# Patient Record
Sex: Male | Born: 1960 | Race: White | Hispanic: No | Marital: Single | Smoking: Former smoker
Health system: Southern US, Community
[De-identification: ages and names within clinical notes are randomized; demographics above are authoritative.]

## PROBLEM LIST (undated history)

## (undated) DIAGNOSIS — R079 Chest pain, unspecified: Secondary | ICD-10-CM

## (undated) DIAGNOSIS — K219 Gastro-esophageal reflux disease without esophagitis: Secondary | ICD-10-CM

## (undated) DIAGNOSIS — B1920 Unspecified viral hepatitis C without hepatic coma: Secondary | ICD-10-CM

## (undated) DIAGNOSIS — I10 Essential (primary) hypertension: Secondary | ICD-10-CM

## (undated) DIAGNOSIS — I5021 Acute systolic (congestive) heart failure: Secondary | ICD-10-CM

## (undated) DIAGNOSIS — I428 Other cardiomyopathies: Secondary | ICD-10-CM

## (undated) DIAGNOSIS — R55 Syncope and collapse: Secondary | ICD-10-CM

## (undated) HISTORY — PX: HAND SURGERY: SHX662

## (undated) HISTORY — PX: TONSILLECTOMY: SUR1361

## (undated) HISTORY — PX: BASAL CELL CARCINOMA EXCISION: SHX1214

## (undated) HISTORY — PX: ORIF CONGENITAL HIP DISLOCATION: SHX2117

## (undated) HISTORY — PX: CARDIAC CATHETERIZATION: SHX172

---

## 1997-11-01 ENCOUNTER — Emergency Department (HOSPITAL_COMMUNITY): Admission: EM | Admit: 1997-11-01 | Discharge: 1997-11-01 | Payer: Self-pay | Admitting: Emergency Medicine

## 1997-11-01 ENCOUNTER — Encounter: Payer: Self-pay | Admitting: Emergency Medicine

## 2001-04-09 ENCOUNTER — Encounter: Payer: Self-pay | Admitting: Emergency Medicine

## 2001-04-09 ENCOUNTER — Emergency Department (HOSPITAL_COMMUNITY): Admission: EM | Admit: 2001-04-09 | Discharge: 2001-04-09 | Payer: Self-pay | Admitting: Emergency Medicine

## 2011-10-04 ENCOUNTER — Emergency Department (HOSPITAL_COMMUNITY): Payer: Self-pay

## 2011-10-04 ENCOUNTER — Inpatient Hospital Stay (HOSPITAL_COMMUNITY)
Admission: EM | Admit: 2011-10-04 | Discharge: 2011-10-09 | DRG: 287 | Disposition: A | Discharge status: Home / Self Care | Payer: 59 | Attending: Family Medicine | Admitting: Family Medicine

## 2011-10-04 ENCOUNTER — Encounter (HOSPITAL_COMMUNITY): Payer: Self-pay | Admitting: Emergency Medicine

## 2011-10-04 ENCOUNTER — Inpatient Hospital Stay (HOSPITAL_COMMUNITY): Payer: Self-pay

## 2011-10-04 DIAGNOSIS — F3289 Other specified depressive episodes: Secondary | ICD-10-CM | POA: Diagnosis present

## 2011-10-04 DIAGNOSIS — R079 Chest pain, unspecified: Secondary | ICD-10-CM | POA: Diagnosis present

## 2011-10-04 DIAGNOSIS — R0789 Other chest pain: Secondary | ICD-10-CM

## 2011-10-04 DIAGNOSIS — I5021 Acute systolic (congestive) heart failure: Principal | ICD-10-CM | POA: Diagnosis present

## 2011-10-04 DIAGNOSIS — B1920 Unspecified viral hepatitis C without hepatic coma: Secondary | ICD-10-CM | POA: Diagnosis present

## 2011-10-04 DIAGNOSIS — I428 Other cardiomyopathies: Secondary | ICD-10-CM | POA: Diagnosis present

## 2011-10-04 DIAGNOSIS — R51 Headache: Secondary | ICD-10-CM | POA: Diagnosis not present

## 2011-10-04 DIAGNOSIS — Z85828 Personal history of other malignant neoplasm of skin: Secondary | ICD-10-CM

## 2011-10-04 DIAGNOSIS — I498 Other specified cardiac arrhythmias: Secondary | ICD-10-CM | POA: Diagnosis present

## 2011-10-04 DIAGNOSIS — R209 Unspecified disturbances of skin sensation: Secondary | ICD-10-CM | POA: Diagnosis present

## 2011-10-04 DIAGNOSIS — F329 Major depressive disorder, single episode, unspecified: Secondary | ICD-10-CM | POA: Diagnosis present

## 2011-10-04 DIAGNOSIS — R42 Dizziness and giddiness: Secondary | ICD-10-CM | POA: Insufficient documentation

## 2011-10-04 DIAGNOSIS — R000 Tachycardia, unspecified: Secondary | ICD-10-CM

## 2011-10-04 DIAGNOSIS — R200 Anesthesia of skin: Secondary | ICD-10-CM | POA: Insufficient documentation

## 2011-10-04 DIAGNOSIS — R0602 Shortness of breath: Secondary | ICD-10-CM

## 2011-10-04 DIAGNOSIS — R6883 Chills (without fever): Secondary | ICD-10-CM | POA: Insufficient documentation

## 2011-10-04 DIAGNOSIS — I509 Heart failure, unspecified: Secondary | ICD-10-CM | POA: Diagnosis present

## 2011-10-04 DIAGNOSIS — Z8249 Family history of ischemic heart disease and other diseases of the circulatory system: Secondary | ICD-10-CM

## 2011-10-04 DIAGNOSIS — I10 Essential (primary) hypertension: Secondary | ICD-10-CM | POA: Diagnosis present

## 2011-10-04 DIAGNOSIS — I429 Cardiomyopathy, unspecified: Secondary | ICD-10-CM

## 2011-10-04 DIAGNOSIS — K219 Gastro-esophageal reflux disease without esophagitis: Secondary | ICD-10-CM | POA: Diagnosis present

## 2011-10-04 DIAGNOSIS — I252 Old myocardial infarction: Secondary | ICD-10-CM

## 2011-10-04 DIAGNOSIS — Z87891 Personal history of nicotine dependence: Secondary | ICD-10-CM

## 2011-10-04 DIAGNOSIS — R55 Syncope and collapse: Secondary | ICD-10-CM | POA: Diagnosis present

## 2011-10-04 DIAGNOSIS — R197 Diarrhea, unspecified: Secondary | ICD-10-CM | POA: Diagnosis present

## 2011-10-04 DIAGNOSIS — K739 Chronic hepatitis, unspecified: Secondary | ICD-10-CM

## 2011-10-04 DIAGNOSIS — F411 Generalized anxiety disorder: Secondary | ICD-10-CM | POA: Diagnosis present

## 2011-10-04 DIAGNOSIS — R634 Abnormal weight loss: Secondary | ICD-10-CM | POA: Diagnosis present

## 2011-10-04 DIAGNOSIS — K625 Hemorrhage of anus and rectum: Secondary | ICD-10-CM | POA: Diagnosis present

## 2011-10-04 HISTORY — DX: Chest pain, unspecified: R07.9

## 2011-10-04 HISTORY — DX: Unspecified viral hepatitis C without hepatic coma: B19.20

## 2011-10-04 HISTORY — DX: Essential (primary) hypertension: I10

## 2011-10-04 HISTORY — DX: Syncope and collapse: R55

## 2011-10-04 HISTORY — DX: Acute systolic (congestive) heart failure: I50.21

## 2011-10-04 HISTORY — DX: Gastro-esophageal reflux disease without esophagitis: K21.9

## 2011-10-04 LAB — RAPID URINE DRUG SCREEN, HOSP PERFORMED
Barbiturates: NOT DETECTED
Benzodiazepines: NOT DETECTED
Tetrahydrocannabinol: NOT DETECTED

## 2011-10-04 LAB — CBC WITH DIFFERENTIAL/PLATELET
Eosinophils Absolute: 0.1 10*3/uL (ref 0.0–0.7)
Eosinophils Relative: 1 % (ref 0–5)
HCT: 54.4 % — ABNORMAL HIGH (ref 39.0–52.0)
Hemoglobin: 19.4 g/dL — ABNORMAL HIGH (ref 13.0–17.0)
Lymphs Abs: 1.5 10*3/uL (ref 0.7–4.0)
MCH: 34 pg (ref 26.0–34.0)
MCV: 95.4 fL (ref 78.0–100.0)
Monocytes Relative: 6 % (ref 3–12)
RBC: 5.7 MIL/uL (ref 4.22–5.81)

## 2011-10-04 LAB — COMPREHENSIVE METABOLIC PANEL
Alkaline Phosphatase: 78 U/L (ref 39–117)
BUN: 14 mg/dL (ref 6–23)
CO2: 26 mEq/L (ref 19–32)
Calcium: 9.6 mg/dL (ref 8.4–10.5)
GFR calc Af Amer: >90 mL/min (ref >90)
GFR calc non Af Amer: >90 mL/min (ref >90)
Glucose, Bld: 130 mg/dL — ABNORMAL HIGH (ref 70–99)
Potassium: 4.3 mEq/L (ref 3.5–5.1)
Total Protein: 7.5 g/dL (ref 6.0–8.3)

## 2011-10-04 LAB — CARDIAC PANEL(CRET KIN+CKTOT+MB+TROPI)
Relative Index: INVALID (ref 0.0–2.5)
Total CK: 53 U/L (ref 7–232)

## 2011-10-04 LAB — CBC
Hemoglobin: 17.6 g/dL — ABNORMAL HIGH (ref 13.0–17.0)
MCH: 33.5 pg (ref 26.0–34.0)
MCV: 94.5 fL (ref 78.0–100.0)
RBC: 5.25 MIL/uL (ref 4.22–5.81)

## 2011-10-04 LAB — CREATININE, SERUM: Creatinine, Ser: 0.77 mg/dL (ref 0.50–1.35)

## 2011-10-04 LAB — TROPONIN I: Troponin I: <0.3 ng/mL (ref <0.30)

## 2011-10-04 LAB — CK TOTAL AND CKMB (NOT AT ARMC): Total CK: 58 U/L (ref 7–232)

## 2011-10-04 MED ORDER — ACETAMINOPHEN 325 MG PO TABS
650.0000 mg | ORAL_TABLET | Freq: Four times a day (QID) | ORAL | Status: DC | PRN
  Administered 2011-10-04: 650 mg via ORAL
  Filled 2011-10-04: qty 1
  Filled 2011-10-04: qty 2

## 2011-10-04 MED ORDER — SODIUM CHLORIDE 0.9 % IJ SOLN
3.0000 mL | Freq: Two times a day (BID) | INTRAMUSCULAR | Status: DC
  Administered 2011-10-04 – 2011-10-09 (×8): 3 mL via INTRAVENOUS

## 2011-10-04 MED ORDER — THIAMINE HCL 100 MG/ML IJ SOLN
Freq: Once | INTRAVENOUS | Status: DC
  Filled 2011-10-04: qty 1000

## 2011-10-04 MED ORDER — PANTOPRAZOLE SODIUM 40 MG PO TBEC
40.0000 mg | DELAYED_RELEASE_TABLET | Freq: Every day | ORAL | Status: DC
  Administered 2011-10-05 – 2011-10-09 (×5): 40 mg via ORAL
  Filled 2011-10-04 (×3): qty 1

## 2011-10-04 MED ORDER — ONDANSETRON HCL 4 MG PO TABS: 4.0000 mg | ORAL_TABLET | Freq: Four times a day (QID) | ORAL | Status: DC | PRN

## 2011-10-04 MED ORDER — SODIUM CHLORIDE 0.9 % IV SOLN
INTRAVENOUS | Status: DC
  Administered 2011-10-04: 1000 mL via INTRAVENOUS

## 2011-10-04 MED ORDER — IOHEXOL 350 MG/ML SOLN
100.0000 mL | Freq: Once | INTRAVENOUS | Status: AC | PRN
  Administered 2011-10-04: 100 mL via INTRAVENOUS

## 2011-10-04 MED ORDER — SODIUM CHLORIDE 0.9 % IV SOLN
1000.0000 mL | Freq: Once | INTRAVENOUS | Status: AC
  Administered 2011-10-04: 1000 mL via INTRAVENOUS

## 2011-10-04 MED ORDER — ONDANSETRON HCL 4 MG/2ML IJ SOLN: 4.0000 mg | Freq: Four times a day (QID) | INTRAMUSCULAR | Status: DC | PRN

## 2011-10-04 MED ORDER — REGADENOSON 0.4 MG/5ML IV SOLN
0.4000 mg | Freq: Once | INTRAVENOUS | Status: AC
  Administered 2011-10-05: 0.4 mg via INTRAVENOUS
  Filled 2011-10-04: qty 5

## 2011-10-04 MED ORDER — NITROGLYCERIN 0.4 MG SL SUBL
0.4000 mg | SUBLINGUAL_TABLET | SUBLINGUAL | Status: DC | PRN
  Administered 2011-10-04 (×2): 0.4 mg via SUBLINGUAL

## 2011-10-04 MED ORDER — TRIAMTERENE-HCTZ 37.5-25 MG PO TABS
1.0000 | ORAL_TABLET | Freq: Every day | ORAL | Status: DC
  Administered 2011-10-05: 1 via ORAL
  Filled 2011-10-04: qty 1

## 2011-10-04 MED ORDER — ALUM & MAG HYDROXIDE-SIMETH 200-200-20 MG/5ML PO SUSP: 30.0000 mL | Freq: Four times a day (QID) | ORAL | Status: DC | PRN

## 2011-10-04 MED ORDER — ASPIRIN 81 MG PO CHEW
324.0000 mg | CHEWABLE_TABLET | Freq: Once | ORAL | Status: AC
  Administered 2011-10-04: 324 mg via ORAL

## 2011-10-04 MED ORDER — ASPIRIN 81 MG PO CHEW
CHEWABLE_TABLET | ORAL | Status: AC
  Administered 2011-10-04: 324 mg via ORAL
  Filled 2011-10-04: qty 4

## 2011-10-04 MED ORDER — SODIUM CHLORIDE 0.9 % IV SOLN
1000.0000 mL | INTRAVENOUS | Status: DC
  Administered 2011-10-04 – 2011-10-05 (×4): 1000 mL via INTRAVENOUS

## 2011-10-04 MED ORDER — SODIUM CHLORIDE 0.9 % IV BOLUS (SEPSIS)
1000.0000 mL | Freq: Once | INTRAVENOUS | Status: AC
  Administered 2011-10-04: 1000 mL via INTRAVENOUS

## 2011-10-04 MED ORDER — HYDROMORPHONE HCL PF 1 MG/ML IJ SOLN
0.5000 mg | INTRAMUSCULAR | Status: AC
  Administered 2011-10-04: 0.5 mg via INTRAVENOUS
  Filled 2011-10-04: qty 1

## 2011-10-04 MED ORDER — METOPROLOL TARTRATE 25 MG PO TABS
50.0000 mg | ORAL_TABLET | Freq: Once | ORAL | Status: AC
  Administered 2011-10-04: 50 mg via ORAL
  Filled 2011-10-04: qty 2

## 2011-10-04 MED ORDER — ACETAMINOPHEN 325 MG PO TABS: 650.0000 mg | ORAL_TABLET | Freq: Four times a day (QID) | ORAL | Status: DC | PRN

## 2011-10-04 MED ORDER — ACETAMINOPHEN 650 MG RE SUPP: 650.0000 mg | Freq: Four times a day (QID) | RECTAL | Status: DC | PRN

## 2011-10-04 MED ORDER — ENOXAPARIN SODIUM 40 MG/0.4ML ~~LOC~~ SOLN
40.0000 mg | SUBCUTANEOUS | Status: DC
  Administered 2011-10-04 – 2011-10-08 (×5): 40 mg via SUBCUTANEOUS
  Filled 2011-10-04 (×6): qty 0.4

## 2011-10-04 NOTE — ED Notes (Signed)
Pt arrives backboarded and c-collared via EMS. Pt was walking in kitchen to living room at home when he woke up in the living room floor. C/o headache, dizziness just prior to passing out. Reports intermittent episodes of same for the last 3 weeks as well as intermittent chest pains, night sweats, numbness/tingling in extremities for approx the last month. Pt also c/o right flank pain. Pt hypertensive 160/130 for EMS. Reports non-compliance with home BP medications. bbg 157. EKG Sinus tach 120s. 20g left hand.

## 2011-10-04 NOTE — ED Notes (Signed)
Pt removed from backboard with MD at bedside for exam

## 2011-10-04 NOTE — ED Notes (Signed)
MD at bedside. 

## 2011-10-04 NOTE — Consult Note (Signed)
Consult Note  Patient ID: Edwin Tyler MRN: [id number], SOB: 10-26-1960 51 y.o. Date of Encounter: 10/04/2011, 2:20 PM  Primary Physician: None Primary Cardiologist: None  Chief Complaint: chest pain and syncope  HPI: 51 y.o. male w/ PMHx significant for uncontrolled HTN and Hepatitis C (treated in 2012) who presented to [facility] on 10/04/2011 with complaints of chest pain and syncope.  No prior cardiac history. No stress test, echo, or cardiac cath. About one month ago was sitting outside and he experienced sudden onset full body numbness and inability to move or talk for about one hour. Since that time he notes loss of appetite, fatigue, shortness of breath, night sweats, productive cough, chest pain, and arm numbness. Last night had chest pain and bilat arm numbness and couldn't get comfortable in bed. He described the pain as a dull, heaviness, like something sitting on his chest. This morning was walking from kitchen to living room and passed out. Reports feeling dizzy before passing out, but has felt dizzy on and off for a few weeks. Unsure how long he was unconscious. Woke up with shortness of breath and chest pain and called EMS who reportedly told him his BP was 160/100. 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 Never felt this chest pain before or passed out. Has not taken any of his medications for about 3-4wks due to financial restrictions. Has a strong family history of early CAD (mother MI 41, brother PCI 55, uncle PCI 79). Also reports palpitations, fever, chills, ~15lb unintentional weight loss over the last 3 weeks, n/v, and loose stools with bright red blood. Denies edema, orthopnea, prolonged immobilization/travel or recent surgery.  In the ED his chest pain was relieved with NTG. EKG shows sinus tachycardia with nonspecific ST/T changes. CXR was without acute cardiopulmonary abnormalities. Head CT  unremarkable. Labs are significant for normal cardiac enzymes, Hgb 19.4, and Tbili 1.5, otherwise unremarkable CBC/CMET. He is chest pain free and afebrile. Telemetry shows sinus tachycardia 110-120s. Not orthostatic. He has received 1L NS, 324mg  ASA, 0.5mg  Dilaudid, and SL NTG x2.   Past Medical History  Diagnosis Date  . Hypertension   . Hepatitis C   . GERD (gastroesophageal reflux disease)      Surgical History:  Past Surgical History  Procedure Date  . Orif congenital hip dislocation   . Basal cell carcinoma excision     back  . Hand surgery     left, glass removal     Home Meds: Has not taken any meds in 3-4wks Lisinopril 10mg  BID Maxzide 37.5/25mg  daily Zantac 300mg  BID claritin 10mg  daily  Allergies: No Known Allergies  History   Social History  . Marital Status: Single    Spouse Name: N/A    Number of Children: N/A  . Years of Education: N/A   Occupational History  . Not on file.   Social History Main Topics  . Smoking status: Former Smoker -- 3 years    Quit date: 02/12/1981  . Smokeless tobacco: Never Used  . Alcohol Use: Yes     6 beers/wk, 1 pint liquor/wk  . Drug Use: No  . Sexually Active: Not on file   Other Topics Concern  . Not on file   Social History Narrative  . No narrative on file     Family History  Problem Relation Age of Onset  . Heart disease Mother  Died of MI at 81  . Heart disease Brother     PCI at 86  . Heart disease Maternal Uncle     PCI at 16    Review of Systems: General: (+) chills, fever, night sweats, unintentional 15lb weight loss Cardiovascular: (+) chest pain, shortness of breath, dyspnea on exertion, palpitations, paroxysmal nocturnal dyspnea negative for edema, orthopnea Dermatological: negative for rash Respiratory: (+) productive cough; negative for  wheezing Urologic: negative for hematuria Abdominal: (+) nausea, vomiting, loss stools, bright red blood per rectum; negative for abd pain, melena, or  hematemesis Neurologic: (+) syncope or dizziness All other systems reviewed and are otherwise negative except as noted above.  Labs:   Component Value Date   WBC 10.5 10/04/2011   HGB 19.4* 10/04/2011   HCT 54.4* 10/04/2011   MCV 95.4 10/04/2011   PLT 175 10/04/2011    Lab 10/04/11 1111  NA 139  K 4.3  CL 102  CO2 26  BUN 14  CREATININE 0.88  CALCIUM 9.6  PROT 7.5  BILITOT 1.5*  ALKPHOS 78  ALT 14  AST 19  GLUCOSE 130*   Basename 10/04/11 1111  CKTOTAL 58  CKMB 2.9  TROPONINI <0.30   Radiology/Studies:   10/04/2011 - Chest 2 View Findings: No active infiltrate or effusion is seen.  There is minimal fullness of the perihilar vasculature.  The heart is mildly enlarged.  No acute bony abnormality is seen.  IMPRESSION: No active lung disease.  Mild cardiomegaly with minimally prominent pulmonary vascularity.   .    10/04/2011 - Ct Head Wo Contrast Findings: The brain has a normal appearance without evidence for hemorrhage, infarction, hydrocephalus, or mass lesion.  There is no extra axial fluid collection.  The skull and paranasal sinuses are normal.  IMPRESSION: Negative exam.       EKG: 10/04/11 @ 1107 - sinus tachycardia 123bpm, ST flattening I, II, III, aVL, aVF, TWI V5-6, no acute ST/T changes  Physical Exam: Blood pressure 152/69, pulse 120, temperature 98.9 F (37.2 C), temperature source Oral, resp. rate 17, SpO2 96.00%. General: Well developed, well nourished, middle aged white male in no acute distress. Head: Normocephalic, atraumatic, sclera non-icteric, nares are without discharge Neck: Supple. Negative for carotid bruits. JVD not elevated. Lungs: Clear bilaterally to auscultation without wheezes, rales, or rhonchi. Breathing is unlabored. Heart: Tachycardic, regular rhythm with S1 S2. No murmurs, rubs, or gallops appreciated. Abdomen: Soft, non-tender, non-distended with normoactive bowel sounds. No rebound/guarding. No obvious abdominal masses. Msk:  Strength  and tone appear normal for age. Extremities: No edema. No clubbing or cyanosis. Distal pedal pulses are intact and equal bilaterally. Neuro: Alert and oriented X 3. Moves all extremities spontaneously. Psych:  Responds to questions appropriately with a normal affect.    ASSESSMENT AND PLAN:  51 y.o. male w/ PMHx significant for uncontrolled HTN and Hepatitis C (treated in 2012) who presented to [facility] on 10/04/2011 with complaints of chest pain and syncope.  1. Syncope 2. Chest pain 3. Hypertension 4. Tachycardia 5. Dizziness 6. Full body numbness 7. Fever, chills, night sweats, cough, loose stools, hematochezia, n/v, unintentional weight loss  Patient has no prior cardiac history, but does have HTN and a significant family history of early CAD. He has had intermittent chest pain for ~55mo which is concerning for angina, but has normal cardiac enzymes and nonacute EKG. Will cycle enzymes and check echo. Plan for lexiscan myoview tomorrow if he rules out and cath if he rules in.  Check lipid panel and A1C for risk stratification. Place on statin if indicated. Low suspicion for PE, but with tachycardia and sob will check DDimer. He had an unwitnessed syncopal event today with preceding dizziness and subsequent chest pain and sob. CT head unremarkable. Unsure if this was a cardiac event. ?seizures, ETOH withdrawal. Will monitor on telemetry and check echo. Check UDS.    Signed, HOPE, JESSICA PA-C 10/04/2011, 2:20 PM Patient seen and examined. I agree with the assessment and plan as detailed above. See also my additional thoughts below.   Cardiology was called to see the patient in the emergency room because he complained of chest discomfort and syncope on admission. He stable at this time. His first troponin is normal. It turns out there is a great deal more to the story. He says that a month ago he had some type of spell during which time he was normal all over and could not talk.  Others in the room noted this. He did not have true syncope. He says he has not been right since that time. The etiology of all of this is not at all clear. Additional history includes complaints of fever, chills, night sweats, cough, loose stools, hematochezia, nausea vomiting, and unintentional weight loss. Etiology of these issues is not clear. Also, the patient had no injury today and his syncopal episode was not witnessed. His EKG changes are consistent with his uncontrolled hypertension history. We are seeing the patient in consultation to help with the workup and rule out cardiac disease. Two-dimensional echo will not be done and a stress nuclear scan will be done. Orthostatic his artery been checked and is said to be normal. A d-dimer has been ordered. We will monitor him. He will be admitted by the internal medicine team to try to help figure out all of the other issues that he complains of.  Willa Rough, MD, [facility] 10/04/2011 3:32 PM

## 2011-10-04 NOTE — ED Notes (Signed)
Agree with previous nursing assessments

## 2011-10-04 NOTE — ED Provider Notes (Addendum)
History     CSN: [id number]  Arrival date & time 10/04/11  1036   First MD Initiated Contact with Patient 10/04/11 1042      Chief Complaint  Patient presents with  . Loss of Consciousness    (Consider location/radiation/quality/duration/timing/severity/associated sxs/prior treatment) The history is provided by the patient.  Edwin Tyler is a 51 y.o. male who presents with syncope and chest pain. Patient has a past pertinent medical history of hepatitis C as well as hypertension. Patient was recently incarcerated rediscovered and hypertension but has not been on his medications for about a month. Patient is had concerning symptoms for about one month, had an episode of near syncope a month ago he has had chest pain intermittent over the last month it is typically worse with exertion. Patient's pain is associated with shortness of breath, radiation to the left side of the neck and left arm. Patient is currently pain-free but is feeling mildly short of breath. Patient's pain can be mild to moderate when it is there. Earlier today, patient was walking from his kitchen to living room NS the last thing he remembered, he says he woke up on the living room floor. Currently he complains of a moderate headache, but no neck pain.  Patient did have some dizziness prior to the syncopal episode. Patient also says last night he had some night sweats. He does relate an incident one month ago where he could not move his body, had difficulty speaking, has generally felt bad since that episode.  Patient's had a pertinent family history mother had a lethal cardiac event 14 years old, his brother is had a stent placed at 28 years old, and his maternal uncle died of MI in his 2s also.   Past Medical History  Diagnosis Date  . Hypertension     History reviewed. No pertinent past surgical history.  History reviewed. No pertinent family history.  History  Substance Use Topics  . Smoking status: Not on file   . Smokeless tobacco: Not on file  . Alcohol Use:       Review of Systems REVIEW OF SYSTEMS:   CONSTITUTIONAL: Positive for night sweats yesterday; No fever, chills or systemic signs of infection. No recent, unexplained weight changes.   HEENT: No facial pain sinus congestion or rhinorrhea is reported. Patient is denying any acute visual or hearing deficits.   NECK: No swelling or masses are reported. No neck pain.  PULMONARY: Intermittent shortness of breath over the past month. Has had some coughing spells; No sputum production  CARDIAC: Patient is had chest pain for the last month. Denies palpitations.   ABDOMINAL: The patient is denying any abdominal pain nausea or vomiting.  Pt has had loose stools for about one month, has had occasional hematochezia every 3-4 days.  GENITOURINARY: No burning with urination or frequency.   BACK: Patient has some right flank pain; No specific thoracic or lumbar pain.   EXTREMITIES: Denying any extremity edema pitting or rash.   NEUROLOGIC: Denying any focal or lateralizing neurologic impairments.      Allergies  Review of patient's allergies indicates no known allergies.  Home Medications  No current outpatient prescriptions on file.  BP 135/99  Pulse 127  Temp 98.9 F (37.2 C) (Oral)  Resp 17  SpO2 97%  Physical Exam  Constitutional: He is oriented to person, place, and time. He appears well-developed and well-nourished.       Anxious  HENT:  Head: Normocephalic and atraumatic.  Right Ear: External ear normal.  Left Ear: External ear normal.  Mouth/Throat: Oropharynx is clear and moist. No oropharyngeal exudate.  Eyes: Conjunctivae and EOM are normal. Pupils are equal, round, and reactive to light. No scleral icterus.  Neck: Normal range of motion. Neck supple.  Cardiovascular: Regular rhythm, normal heart sounds and intact distal pulses.  Tachycardia present.   Pulmonary/Chest: Effort normal and breath sounds normal. No  respiratory distress. He has no wheezes. He has no rales. He exhibits no tenderness.  Abdominal: Soft. Bowel sounds are normal. He exhibits no distension. There is no tenderness. There is no rebound.  Musculoskeletal: Normal range of motion.  Neurological: He is alert and oriented to person, place, and time. He has normal strength. No cranial nerve deficit or sensory deficit. GCS eye subscore is 4. GCS verbal subscore is 5. GCS motor subscore is 6.  Reflex Scores:      Patellar reflexes are 2+ on the right side and 2+ on the left side.      Achilles reflexes are 1+ on the right side and 2+ on the left side. Skin: Skin is warm and dry.    ED Course  Procedures (including critical care time)  Labs Reviewed  CBC WITH DIFFERENTIAL - Abnormal; Notable for the following:    Hemoglobin 19.4 (*)     HCT 54.4 (*)     Neutrophils Relative 78 (*)     Neutro Abs 8.3 (*)     All other components within normal limits  COMPREHENSIVE METABOLIC PANEL - Abnormal; Notable for the following:    Glucose, Bld 130 (*)     Total Bilirubin 1.5 (*)     All other components within normal limits  CK TOTAL AND CKMB  TROPONIN I  POCT CBG (FASTING - GLUCOSE)-MANUAL ENTRY   Ct Head Wo Contrast  10/04/2011  *RADIOLOGY REPORT*  Clinical Data: Loss of consciousness.  Syncope.  CT HEAD WITHOUT CONTRAST  Technique:  Contiguous axial images were obtained from the base of the skull through the vertex without contrast.  Comparison: None.  Findings: The brain has a normal appearance without evidence for hemorrhage, infarction, hydrocephalus, or mass lesion.  There is no extra axial fluid collection.  The skull and paranasal sinuses are normal.  IMPRESSION: Negative exam.   Original Report Authenticated By: Rosealee Albee, M.D.      No diagnosis found.    MDM  Edwin Tyler is a 51 y.o. male with a pertinent family history of cardiovascular disease is also got hypertension presenting with a month's worth of history of  chest pain, shortness of breath as well as near syncope and syncope. Patient's initial EKG shows a sinus tachycardia 123 beats per minute and when compared with prior EKG dated 04/09/2001 for some mild flattening of the ST segment in V2 V3 however there is no ST elevation and signs of acute or infarction at this time. Axis is normal, sinus rhythm.  Patient is a high risk based on his family history, as well as current symptoms, normal serous saline fluid bolus was given, aspirin was given. EKG was obtained, basic labs and chest x-ray obtained. Patient will require admission for further cardiac risk stratification at this time, there is no indication to activate cardiac catheterization lab.  Labs are largely within normal limits with the exception of and elevated H&H suggesting hemoconcentration and a mildly elevated Tbil.  Pt does have h/o HCV.  Pt does admit on further questioning to consuming alcohol a  few days ago to deal with chest pain, again denies daily EtOH use.  Pt may have a component of EtOH w/d though he adamantly denies it.  Pt remains tachycardic to 120, but is currently symptom free.  Patient's last EKG in 2003 was mildly tachycardic also.  Initiated another fluid bolus and will start banana bag at 150cc/hour afterward.  Doubt PE at this time. Patient does not have clinical signs and symptoms of DVT.  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 PE is not the number one diagnosis on my differential OR equally as likely as the number one diagnosis on my differential. The patient IS tachycardiac (HR >100). Patient has not had immobilization >3 days in the past 4 weeks surgery or trauma in the past 4 weeks, patient has not had a prior DVT or PE. Patient has not had hemoptysis and denies having active malignancy and has not had treatment for cancer within the past 6 months. Patient has a Well's Score for DVT of 1, and therefore a low risk of PE.  Low risk group 0-1: 1.3% chance  of PE in an ED population. Alternative study had scores ? 4 as 'PE Unlikely' and had a 3% incidence of PE.  Intermediate risk group 2-6: 16.2% chance of PE in an ED population.  Alternative study had scores > 4 as 'PE Likely' and had a 28% incidence of PE.  High risk group >=7: 40.6% chance of PE in an ED population.  Pt has had diarrhea for a month witho occasional hematochezia (increased H&H) has had recent life stress, but primary urgent concern is cardiac at this point.  No STEMI at this time.  Cardiac biomarkers normal.  Pt may also have an issue with cervical stenosis et, but neuro exam completely normal on my exam.    DW with Trish from Cardiology for admission.     4:03 PM Cardiology saw pt, ordered some f/u labs, but request medicine admission.  I D/W Family Practice who will admit the patient for further diagnostics and therapy  Jones Skene, MD 10/04/11 1347  Jones Skene, MD 10/04/11 1610

## 2011-10-04 NOTE — H&P (Signed)
Family Medicine Teaching [facility] Admission History and Physical Service Pager: [phone]  Patient name: Edwin Tyler Medical record number: [id number] Date of birth: Jun 05, 1960 Age: 51 y.o. Gender: male  Primary Care Provider: Sheila Oats, MD  Chief Complaint: Loss of consciousness/Syncope  History of Present Illness: 51 year old male with PMH of uncontrolled HTN and Hepatitis C who presents with chest pain, SOB, and recent syncopal episode.  This morning patient was walking from kitchen to living room and passed out. He reports feeling dizzy before passing out, but has felt dizzy on and off for a few weeks. He was unsure how long he was unconscious.  We patient woke up he was acutely SOB and reported chest pain.  Chest pain was associated with radiation to left neck and left arm.  In the ED, chest pain was relieved with NTG.  EKG showed sinus tachycardia with LVH. Head CT was obtained given syncopal episode and was unremarkable.    Upon further questioning, patient has felt poorly for the past 5 weeks.  This began following an episode where patient experienced full body numbness and inability to talk for approximately 15 minutes.  Speech spontaneously came back and numbness resolved.  He did have an episode of near-syncope the day after this episode 5 weeks ago.  Since then patient notes loss of appetite, fatigue, SOB, night sweats, cough, bilateral arm numbness (wrist to shoulder), and fever/chills.  Patient also reports diarrhea and bright red blood per rectum (primarily when wiping) and recent 10-15 lb weight loss.  Denies vomiting or melena.   Of note, patient has known hypertension and hep C, discovered while he was incarcerate.  He has been out of jail x1 year.  He was given 6 months worth of refills for his antihypertensive and GERD medications (lisinopril, maxide, zantac).  He is aware he has been taking his medications inappropriately and recently, ~2 weeks ago, ran out  entirely of all medications.  He was also supposed to follow up about his hepatitis C within 6 months, which he has not done.    Patient Active Problem List  Diagnosis  . Syncope  . Chest pain  . Numbness  . Weight loss  . Dizziness  . Hypertension  . Chills   Past Medical History: Past Medical History  Diagnosis Date  . Hypertension   . Hepatitis C   . GERD (gastroesophageal reflux disease)    Past Surgical History: Past Surgical History  Procedure Date  . Orif congenital hip dislocation   . Basal cell carcinoma excision     back  . Hand surgery     left, glass removal   Social History: History  Substance Use Topics  . Smoking status: Former Smoker -- 3 years    Quit date: 02/12/1981  . Smokeless tobacco: Never Used  . Alcohol Use: Yes     6 beers/wk, 1 pint liquor/wk    Family History: Family History  Problem Relation Age of Onset  . Heart disease Mother     Died of MI at 80  . Heart disease Brother     PCI at 35  . Heart disease Maternal Uncle     PCI at 28   Allergies: No Known Allergies No current facility-administered medications on file prior to encounter.   Current Outpatient Prescriptions on File Prior to Encounter  Medication Sig Dispense Refill  . lisinopril (PRINIVIL,ZESTRIL) 10 MG tablet Take 10 mg by mouth 2 (two) times daily.      Marland Kitchen  loratadine (CLARITIN) 10 MG tablet Take 10 mg by mouth daily.      . ranitidine (ZANTAC) 300 MG tablet Take 300 mg by mouth 2 (two) times daily.      Marland Kitchen triamterene-hydrochlorothiazide (MAXZIDE-25) 37.5-25 MG per tablet Take 1 tablet by mouth daily.       Review Of Systems: See HPI  Physical Exam: BP 154/100  Pulse 132  Temp 98.9 F (37.2 C) (Oral)  Resp 27  SpO2 96%  Exam: General: awake, alert. NAD. HEENT: MMM, NCAT. PERRLA. EOMI, no pharyngeal erythema or exudate, nasal mucosa normal Cardiovascular: Tachycardic. Regular rhythm. No murmurs, rubs, or gallops Respiratory: Bibasilar rales  noted Abdomen: soft, nontender, nondistended. +BS Extremities: warm, well perfused. No edema noted. Skin: warm, dry, intact. Neuro: CN 2-12 grossly intact. No focal deficits. Reported numbness/decreased sensation of arms bilaterally.  Muscle strength 5/5 in both upper and lower extremities.   Rectal: normal tone, no obvious external hemmeroids, no gross blood after exam, FOB negative.   Labs and Imaging: CBC BMET   Lab 10/04/11 1111  WBC 10.5  HGB 19.4*  HCT 54.4*  PLT 175    Lab 10/04/11 1111  NA 139  K 4.3  CL 102  CO2 26  BUN 14  CREATININE 0.88  GLUCOSE 130*  CALCIUM 9.6     Total bilirubin - 1.5 D dimer - 0.60 Hemocult - negative  Cardiac Panel (last 3 results)  Basename 10/04/11 1111  CKTOTAL 58  CKMB 2.9  TROPONINI <0.30  RELINDX RELATIVE INDEX IS INVALID   Dg Chest 2 View  10/04/2011 MPRESSION: No active lung disease.  Mild cardiomegaly with minimally prominent pulmonary vascularity.   Ct Head Wo Contrast  10/04/2011   Findings: The brain has a normal appearance without evidence for hemorrhage, infarction, hydrocephalus, or mass lesion.  There is no extra axial fluid collection.  The skull and paranasal sinuses are normal.  IMPRESSION: Negative exam.    EKG: Sinus tachycardia, LVH.  Assessment and Plan: MOLLY MASELLI is a 51 y.o. year old male with PMH of Hepatitis C and uncontrolled HTN who presents with chest pain, SOB and recent unwitnessed syncopal episode.   # Chest pain - EKG revealed sinus tach and LVH.  Patient does have significant risk factors - HTN and Family history of CAD. - Will admit to Telemetry, Family Medicine Teaching Service, Attending Dr. Mauricio Po - Cardiology already consulted and have seen/evaluated the patient.  Nuclear stress test and Echo ordered and planned for tomorrow. - Will get serial Cardiac enzymes to rule out ACS. 1st set of CE negative.  Patient with uncontrolled HTN and significant family history of early cardiac events,  including MI in his mother at age 43, and stents in his brother and maternal uncle at ages 58 and 47, respectively. - Will continue to follow Cardiology recommendations - Will continue sublingual nitro, ASA.  Will start Lopressor 50 mg - Will obtain TSH, lipid panel, and A1C  # SOB - Patient has tachycardia and worsening SOB - D dimer obtained and was elevated at 0.60. - Will get CTA to rule out PE - Will monitor closely with continuous Pulse ox   # Syncope - Unsure of etiology. Event was unwitnessed. No reports of incontinence.  Unlikely to be seizure activity. - Basic laboratory work up unremarkable, orthostatics negative, EKG revealed no arrythmia  - Will continue to monitor on telemetry  # Hepatitis C - Patient underwent prior treatment in 2012 and has not followed up. -  Patient reports that he had undetectable viral load following treatment - Will need outpatient follow up regarding Hep C.  # HTN - Will continue home Lisinopril and HCTZ, although pt reports no compliance for the past 2 weeks and occasional compliance prior to that for the past 1 year  # Arm numbness - Will order B12, TSH, RPR given recent history of numbness - Distribution is not consistent with focal lesion, no need for imaging   # Diarrhea, Bright red blood per rectum - Patient's Hemoglobin is stable at this time - Hemocult negative - Will monitor closely and consider further inpatient workup if patient continues to have blood in stool or hemoglobin begins to drop - Will consider stool cultures if continued diarrhea - If persists, patient will need outpatient follow up  # Weight loss - Patient reports recent nausea, poor appetite, and weight loss of approximately 15 lbs  - Will order HIV ab given weight loss.  Patient at increased risk given history of Hepatitis C (unknown how patient contracted).   FEN/GI: NS @ 100 mL/hr, Heart healthy diet Prophylaxis: Lovenox SubQ Disposition: Pending clinical  improvement and further workup Code Status: Full code  [name], DO 10/04/2011, 4:20 PM   UPPER LEVEL ADDENDUM  I have seen and examined Ned Clines with Dr. Adriana Simas and I agree with the above assessment/plan. I have reviewed all available data and have made any necessary changes to the above H&P.  Josedaniel Haye 10/04/2011, 5:55 PM    FMTS Attending Admit Note Patient seen and examined by me in the ED at 1750pm, I discussed with admitting resident team and I agree with the assessment/plan as documented, with following additions: Briefly, a 51yoM with untreated HTN and a strong family hx of CHD, who presents with acute onset chest pain/tightness and syncopal episode this morning, in the setting of recent anorexia and poor oral intake.  He recalls feeling of presyncope "like I might pass out" this morning while walking through his house; unwitnessed LOC for indeterminate period of time. Does not sound like postictal state when he regained consciousness.  In ROS he reports subacute fevers and sweats, involuntary weight loss, and seeing red blood on paper when cleaning himself; some diarrhea/loose stools; and foul-smelling urine that prompted him to get HIV test at health dept 3 weeks ago and treatment for presumed Trichomonas based on urethral swab testing.  Patient was treated with interferon for 12 months (completed May 2012) for HCV, told he had undetectable viral load and encouraged to have repeat viral load at 1 year, which he has not had.    In ED he has been persistently tachycardic, with initial set of CEs that are negative and ECG that is significant for LVH and sinus tachycardia. At time of my exam he denies chest pain but has had exacerbation of HA since receiving sublingual NTG.   He has had dyspnea and tachycardia in setting of chest pain and mildly elevated D dimer; for CTA chest to rule out PE.  Assess/Plan:1. Tachycardia/chest pain: for CTA to rule out PE.  CXR unremarkable. To  increase IVF; may use low dose B blocker for BP and rate control.  He emphatically denies ever using cocaine; does drink some alcohol (2 beers a day), and had a remote history of THC use.  Cardiology has seen the patient in the ER and is planning ECHO and nuclear stress test.   2. Weight loss, sweats: To check HIV and RPR.  May also check HCV  viral load given report of having undetectable viral load after completing his interferon regimen.  3. He gives report of blood per rectum and recent diarrhea.  His Hgb is 19 despite absence of documented lung disease.  Will follow Hgb in morning; may consider endoscopic workup as outpatient, unless patient develops brisk blood per rectum or drop in Hgb consistent with GIB. 4.Concern for TIA in setting of expressive aphasic episode 5 weeks ago.  Patient at high risk for stroke.  Evaluate for primary prevention with ECHO; TSH; A1C; lipid panel; control of BP.  5. Patient is visibly distraught when discussing his rationale for pursuing HIV test 3 weeks ago.  He recently learned that the mother of his daughter has been using crack and engaging in sex work.  He has remained sexually active with her. Patient himself denies any cocaine use now or in the past.  Paula Compton, MD

## 2011-10-04 NOTE — ED Notes (Signed)
Pt returned from xray

## 2011-10-04 NOTE — ED Notes (Signed)
Pt transported to CT with Westley Gambles

## 2011-10-05 ENCOUNTER — Inpatient Hospital Stay (HOSPITAL_COMMUNITY): Payer: Self-pay

## 2011-10-05 ENCOUNTER — Encounter (HOSPITAL_COMMUNITY): Payer: Self-pay | Admitting: Internal Medicine

## 2011-10-05 DIAGNOSIS — I10 Essential (primary) hypertension: Secondary | ICD-10-CM

## 2011-10-05 DIAGNOSIS — I2510 Atherosclerotic heart disease of native coronary artery without angina pectoris: Secondary | ICD-10-CM

## 2011-10-05 DIAGNOSIS — R079 Chest pain, unspecified: Secondary | ICD-10-CM

## 2011-10-05 DIAGNOSIS — I509 Heart failure, unspecified: Secondary | ICD-10-CM

## 2011-10-05 DIAGNOSIS — R072 Precordial pain: Secondary | ICD-10-CM

## 2011-10-05 DIAGNOSIS — I5021 Acute systolic (congestive) heart failure: Secondary | ICD-10-CM

## 2011-10-05 DIAGNOSIS — R000 Tachycardia, unspecified: Secondary | ICD-10-CM

## 2011-10-05 DIAGNOSIS — I428 Other cardiomyopathies: Secondary | ICD-10-CM

## 2011-10-05 DIAGNOSIS — R55 Syncope and collapse: Secondary | ICD-10-CM

## 2011-10-05 HISTORY — DX: Other cardiomyopathies: I42.8

## 2011-10-05 HISTORY — DX: Syncope and collapse: R55

## 2011-10-05 HISTORY — DX: Chest pain, unspecified: R07.9

## 2011-10-05 HISTORY — DX: Acute systolic (congestive) heart failure: I50.21

## 2011-10-05 LAB — CARDIAC PANEL(CRET KIN+CKTOT+MB+TROPI)
Relative Index: INVALID (ref 0.0–2.5)
Troponin I: <0.3 ng/mL (ref <0.30)

## 2011-10-05 LAB — COMPREHENSIVE METABOLIC PANEL
Albumin: 3.1 g/dL — ABNORMAL LOW (ref 3.5–5.2)
Alkaline Phosphatase: 64 U/L (ref 39–117)
BUN: 15 mg/dL (ref 6–23)
Creatinine, Ser: 0.73 mg/dL (ref 0.50–1.35)
GFR calc Af Amer: >90 mL/min (ref >90)
Glucose, Bld: 116 mg/dL — ABNORMAL HIGH (ref 70–99)
Total Protein: 6.6 g/dL (ref 6.0–8.3)

## 2011-10-05 LAB — CBC
HCT: 49 % (ref 39.0–52.0)
MCV: 94.8 fL (ref 78.0–100.0)
Platelets: 157 10*3/uL (ref 150–400)
RBC: 5.17 MIL/uL (ref 4.22–5.81)
RDW: 12.9 % (ref 11.5–15.5)
WBC: 10.7 10*3/uL — ABNORMAL HIGH (ref 4.0–10.5)

## 2011-10-05 LAB — LIPID PANEL
Cholesterol: 125 mg/dL (ref 0–200)
HDL: 55 mg/dL (ref >39)
Total CHOL/HDL Ratio: 2.3 RATIO

## 2011-10-05 LAB — RPR: RPR Ser Ql: NONREACTIVE

## 2011-10-05 LAB — HEMOGLOBIN A1C
Hgb A1c MFr Bld: 5.5 % (ref <5.7)
Mean Plasma Glucose: 111 mg/dL (ref <117)

## 2011-10-05 MED ORDER — TECHNETIUM TC 99M TETROFOSMIN IV KIT
10.0000 | PACK | Freq: Once | INTRAVENOUS | Status: AC | PRN
  Administered 2011-10-05: 10 via INTRAVENOUS

## 2011-10-05 MED ORDER — HYDROCODONE-ACETAMINOPHEN 5-325 MG PO TABS
1.0000 | ORAL_TABLET | ORAL | Status: DC | PRN
  Administered 2011-10-05 – 2011-10-07 (×9): 1 via ORAL
  Filled 2011-10-05 (×10): qty 1

## 2011-10-05 MED ORDER — BOOST / RESOURCE BREEZE PO LIQD
1.0000 | Freq: Three times a day (TID) | ORAL | Status: DC
  Administered 2011-10-05 – 2011-10-09 (×7): 1 via ORAL

## 2011-10-05 MED ORDER — REGADENOSON 0.4 MG/5ML IV SOLN
INTRAVENOUS | Status: AC
  Filled 2011-10-05: qty 5

## 2011-10-05 MED ORDER — CITALOPRAM HYDROBROMIDE 20 MG PO TABS
20.0000 mg | ORAL_TABLET | Freq: Every day | ORAL | Status: DC
  Administered 2011-10-05 – 2011-10-09 (×5): 20 mg via ORAL
  Filled 2011-10-05 (×5): qty 1

## 2011-10-05 MED ORDER — ASPIRIN 81 MG PO CHEW
81.0000 mg | CHEWABLE_TABLET | Freq: Every day | ORAL | Status: DC
  Administered 2011-10-05 – 2011-10-09 (×5): 81 mg via ORAL
  Filled 2011-10-05 (×4): qty 1

## 2011-10-05 MED ORDER — METOPROLOL TARTRATE 50 MG PO TABS
50.0000 mg | ORAL_TABLET | Freq: Two times a day (BID) | ORAL | Status: DC
  Administered 2011-10-05 (×2): 50 mg via ORAL
  Filled 2011-10-05 (×3): qty 1

## 2011-10-05 MED ORDER — LISINOPRIL 5 MG PO TABS
5.0000 mg | ORAL_TABLET | Freq: Every day | ORAL | Status: DC
  Administered 2011-10-06: 5 mg via ORAL
  Filled 2011-10-05: qty 1

## 2011-10-05 MED ORDER — IBUPROFEN 200 MG PO TABS
ORAL_TABLET | ORAL | Status: AC
  Filled 2011-10-05: qty 2

## 2011-10-05 MED ORDER — TECHNETIUM TC 99M TETROFOSMIN IV KIT
30.0000 | PACK | Freq: Once | INTRAVENOUS | Status: AC | PRN
  Administered 2011-10-05: 30 via INTRAVENOUS

## 2011-10-05 MED ORDER — IBUPROFEN 600 MG PO TABS
600.0000 mg | ORAL_TABLET | Freq: Once | ORAL | Status: AC
  Administered 2011-10-05: 600 mg via ORAL
  Filled 2011-10-05: qty 1

## 2011-10-05 MED ORDER — IBUPROFEN 200 MG PO TABS
ORAL_TABLET | ORAL | Status: AC
  Filled 2011-10-05: qty 1

## 2011-10-05 MED ORDER — CARVEDILOL 6.25 MG PO TABS
6.2500 mg | ORAL_TABLET | Freq: Two times a day (BID) | ORAL | Status: DC
  Administered 2011-10-06 – 2011-10-09 (×6): 6.25 mg via ORAL
  Filled 2011-10-05 (×9): qty 1

## 2011-10-05 NOTE — Progress Notes (Signed)
Seen and examined.  This gentleman is not doing well.  Issues: New CHF systolic dysfunction.  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  Will defer to cards whether needs further eval for likely ischemic cardiomyopathy.  He has an impressive FHx of CAD.  Depression:  Teary throughout my full interview.  Significant other problems.  Struggling with news of "weak heart"  Obvious depression.  Discussed and will start celexa.  Diarrhea: stool studies pending.

## 2011-10-05 NOTE — OR Nursing (Signed)
Definity study completed with pt in echo department with endo RNs.  BP pre-procedure was 107/80.  2 mL given through IV site during echo.  Flushed with NS afterwards.  Post BP 129/93.  Pt tolerated medication and study well, no complaints.  Mercy Moore, RN/Leanne Maple Hudson, RN

## 2011-10-05 NOTE — Progress Notes (Signed)
Pt admitted with recent syncope and sinus tachycardia.  Found to have newly diagnosed cardiomyopathy.   SUBJECTIVE: The patient is crying when I entered the room.  He states that he has "too much stress" in his life.  He relates most of this stress to a relationship problem with the mother of his child. He reports mild SOB.  His chest pain is quiescent.  No new concerns.     Marland Kitchen aspirin  81 mg Oral Daily  . citalopram  20 mg Oral Daily  . enoxaparin (LOVENOX) injection  40 mg Subcutaneous Q24H  . feeding supplement  1 Container Oral TID BM  . ibuprofen      . ibuprofen      . ibuprofen  600 mg Oral Once  . metoprolol tartrate  50 mg Oral BID  . pantoprazole  40 mg Oral Q1200  . regadenoson      . regadenoson  0.4 mg Intravenous Once  . banana bag IV fluid 1000 mL   Intravenous Once  . sodium chloride  3 mL Intravenous Q12H  . triamterene-hydrochlorothiazide  1 each Oral Daily      . sodium chloride 1,000 mL (10/05/11 2027)  . sodium chloride 1,000 mL (10/04/11 2142)    OBJECTIVE: Physical Exam: Filed Vitals:   10/05/11 1010 10/05/11 1012 10/05/11 1014 10/05/11 2141  BP: 126/85 122/87 122/87 122/91  Pulse: 122 117 115 112  Temp:    97.2 F (36.2 C)  TempSrc:    Oral  Resp:    20  Height:      Weight:      SpO2:    97%    Intake/Output Summary (Last 24 hours) at 10/05/11 2154 Last data filed at 10/05/11 2145  Gross per 24 hour  Intake 2474.58 ml  Output   1650 ml  Net 824.58 ml    Telemetry reveals sinus tachycardia, no arrhythmias  GEN- The patient is tearful, NAD   Head- normocephalic, atraumatic Eyes-  Sclera clear, conjunctiva pink Ears- hearing intact Oropharynx- clear Neck- supple, JVP 9cm Lungs- Clear to ausculation bilaterally, normal work of breathing Heart- tachycardic regular rhythm, no murmurs, rubs or gallops, PMI not laterally displaced GI- soft, NT, ND, + BS Extremities- no clubbing, cyanosis, trace edema Skin- no rash or  lesion Psych- anxious mood, tearful affect Neuro- strength and sensation are intact  LABS: Basic Metabolic Panel:  Basename 10/05/11 0556 10/04/11 2121 10/04/11 1111  NA 137 -- 139  K 4.0 -- 4.3  CL 103 -- 102  CO2 22 -- 26  GLUCOSE 116* -- 130*  BUN 15 -- 14  CREATININE 0.73 0.77 --  CALCIUM 8.8 -- 9.6  MG -- -- --  PHOS -- -- --   Liver Function Tests:  [facility] 10/05/11 0556 10/04/11 1111  AST 16 19  ALT 11 14  ALKPHOS 64 78  BILITOT 0.6 1.5*  PROT 6.6 7.5  ALBUMIN 3.1* 3.7   No results found for this basename: LIPASE:2,AMYLASE:2 in the last 72 hours CBC:  Basename 10/05/11 0556 10/04/11 2121 10/04/11 1111  WBC 10.7* 9.3 --  NEUTROABS -- -- 8.3*  HGB 17.3* 17.6* --  HCT 49.0 49.6 --  MCV 94.8 94.5 --  PLT 157 176 --   Cardiac Enzymes:  Basename 10/04/11 2357 10/04/11 1745 10/04/11 1111  CKTOTAL 50 53 58  CKMB 3.0 3.0 2.9  CKMBINDEX -- -- --  TROPONINI <0.30 <0.30 <0.30   BNP: No components found with this basename: POCBNP:3 D-Dimer:  Basename 10/04/11 1512  DDIMER 0.60*   Hemoglobin A1C:  Basename 10/04/11 1512  HGBA1C 5.5   Fasting Lipid Panel:  Basename 10/05/11 0555  CHOL 125  HDL 55  LDLCALC 54  TRIG 80  CHOLHDL 2.3  LDLDIRECT --   Thyroid Function Tests:  Basename 10/04/11 2121  TSH 1.171  T4TOTAL --  T3FREE --  THYROIDAB --   Anemia Panel:  Basename 10/04/11 1745  VITAMINB12 780  FOLATE --  FERRITIN --  TIBC --  IRON --  RETICCTPCT --    RADIOLOGY: Dg Chest 2 View  10/04/2011  *RADIOLOGY REPORT*  Clinical Data: Loss of consciousness, left chest and arm pain  CHEST - 2 VIEW  Comparison: None.  Findings: No active infiltrate or effusion is seen.  There is minimal fullness of the perihilar vasculature.  The heart is mildly enlarged.  No acute bony abnormality is seen.  IMPRESSION: No active lung disease.  Mild cardiomegaly with minimally prominent pulmonary vascularity.   Original Report Authenticated By: Juline Patch,  M.D.    Ct Head Wo Contrast  10/04/2011  *RADIOLOGY REPORT*  Clinical Data: Loss of consciousness.  Syncope.  CT HEAD WITHOUT CONTRAST  Technique:  Contiguous axial images were obtained from the base of the skull through the vertex without contrast.  Comparison: None.  Findings: The brain has a normal appearance without evidence for hemorrhage, infarction, hydrocephalus, or mass lesion.  There is no extra axial fluid collection.  The skull and paranasal sinuses are normal.  IMPRESSION: Negative exam.   Original Report Authenticated By: Rosealee Albee, M.D.    Ct Angio Chest Pe W/cm &/or Wo Cm  10/04/2011  *RADIOLOGY REPORT*  Clinical Data: Chest pain, shortness of breath and recent syncopal episode.  Dizziness prior to the syncopal episode.  CT ANGIOGRAPHY CHEST  Technique:  Multidetector CT imaging of the chest using the standard protocol during bolus administration of intravenous contrast. Multiplanar reconstructed images including MIPs were obtained and reviewed to evaluate the vascular anatomy.  Contrast: OMNIPAQUE IOHEXOL 350 MG/ML SOLN  Comparison: Chest radiographs obtained earlier today.  Findings: Normally opacified pulmonary arteries with no pulmonary arterial filling defects seen.  Clear lungs.  No lung masses or enlarged lymph nodes.  Thoracic spine degenerative changes. Unremarkable upper abdomen.  IMPRESSION:   No pulmonary emboli or acute abnormality.   Original Report Authenticated By: Darrol Angel, M.D.    Nm Myocar Multi W/spect W/wall Motion / Ef  10/05/2011  The patient underwent stress testing with Lexiscan protocol under supervision of cardiology staff. EKG at rest shows LVH with strain pattern which persists unchanged during and after stress. Patient experience dyspnea and diaphoresis with stress. No chest pain.  The quality of the images is fair.  The images are degraded somewhat by motion artefact.  Perfusion images demonstrate a small fixed defect of the midanteroseptal  wall.  There is no significant reversibility. Diaphramatic attenuation is present at the base of rest and stress images.  The left ventricle is dilated.  The EDV is 275 ml.  The ESV is 218 ml.  The calculated ejection fraction is markedly reduced at 21%. There is severe global hypokinesis.  Impression: Abnormal Lexiscan Myoview with old small midanteroseptal scar and with severe LV dilatation and global hypokinesis.  EF 21%.  Thomas A. Brackbill MD   Original Report Authenticated By: Heriberto Antigua     ASSESSMENT AND PLAN:  Active Problems:  Syncope  Chest pain  Numbness  Weight loss  Dizziness  Hypertension  Chills  1. Acute systolic dysfunction EF is newly depressed.  This is of an unclear etiology.  I have reviewed his echo and myoview.  Though there were no obvious areas of ischemia, with reduced EF and recent chest pain, I think that it would be prudent to proceed with diagnostic cath on Monday. He admits to heavy ETOH previously.  He had apparently quit for a while but recently has started drinking again.  This may also be the cause for his cardiomyopathy.  Lifestyle modification is advised. - switch metoprolol to coreg - add ace inhibitor as BP allows - stop IVF,  Keep Is and Os even at this point, may require gentle diuresis.   2. Syncope- the cause for his syncope is presently not clear.  His has a somewhat bizarre affect and history.  I am not certain that his syncope was related to arrhythmia, though with newly depressed EF, I think that we have to consider this as a strong possibility.  Given his comorbidities as well as emotional instability and recent heavy ETOH, I do not think that he is presently a candidate for an ICD.  I would favor aggressive medical management and repeat EF in 3-6 months before making this decision (unless he has significant arrhythmias observed in the hospital).  I do think that he should have a LifeVest placed at discharge.  Cardiology can arrange this early next  week. He should be instructed to not drive for 6 months following his syncopal event.  3. Tachycardia- no longer volume deplete.  I suspect that tachycardia may be due to his cardiomyopathy.  ETOh withdrawal could also be a possibility.  4. Fevers, night sweats, loose stools- per primary team  I would anticipate that he would remain in the hospital over the weekend and proceed with cath on Monday.   Further workup pending results of his cath.  Hillis Range, MD 10/05/2011 9:54 PM

## 2011-10-05 NOTE — H&P (Signed)
FMTS Attending Note Patient seen and examined by me in ED on 10/04/2011, please see my note that is included within the body of the Resident H&P.  Paula Compton, MD

## 2011-10-05 NOTE — Progress Notes (Signed)
Family Medicine Teaching Service Daily Progress Note Service Page: [phone]  Subjective:  Patient continues to have SOB and was placed on oxygen last night.  Patient reported 4 episodes of diarrhea last night - no blood but foul smelling. Patient appears anxious and is concerned about his symptoms.  Objective: Temp:  [97.9 F (36.6 C)-98.9 F (37.2 C)] 97.9 F (36.6 C) (08/23 0545) Pulse Rate:  [60-132] 117  (08/23 0545) Resp:  [14-27] 20  (08/23 0545) BP: (133-154)/(69-121) 134/91 mmHg (08/23 0545) SpO2:  [93 %-98 %] 95 % (08/23 0545) Weight:  [221 lb 3.2 oz (100.336 kg)-223 lb 5.2 oz (101.3 kg)] 221 lb 3.2 oz (100.336 kg) (08/23 0536)  Exam: General: awake, alert, NAD. Cardiovascular: Tachcardic. No murmurs, rubs, or gallops. Respiratory: CTAB. No rales, rhonchi, or wheeze. Abdomen: soft, nontender, nondistended. +BS. Extremities: no edema noted.   I have reviewed the patient's medications, labs, imaging, and diagnostic testing.  Notable results are summarized below.  CBC BMET   Lab 10/05/11 0556 10/04/11 2121 10/04/11 1111  WBC 10.7* 9.3 10.5  HGB 17.3* 17.6* 19.4*  HCT 49.0 49.6 54.4*  PLT 157 176 175    Lab 10/05/11 0556 10/04/11 2121 10/04/11 1111  NA 137 -- 139  K 4.0 -- 4.3  CL 103 -- 102  CO2 22 -- 26  BUN 15 -- 14  CREATININE 0.73 0.77 0.88  GLUCOSE 116* -- 130*  CALCIUM 8.8 -- 9.6     Cardiac Panel (last 3 results)  Basename 10/04/11 2357 10/04/11 1745 10/04/11 1111  CKTOTAL 50 53 58  CKMB 3.0 3.0 2.9  TROPONINI <0.30 <0.30 <0.30  RELINDX RELATIVE INDEX IS INVALID RELATIVE INDEX IS INVALID RELATIVE INDEX IS INVALID   Lipid Panel     Component Value Date/Time   CHOL 125 10/05/2011 0555   TRIG 80 10/05/2011 0555   HDL 55 10/05/2011 0555   CHOLHDL 2.3 10/05/2011 0555   VLDL 16 10/05/2011 0555   LDLCALC 54 10/05/2011 0555   Lab Results  Component Value Date   HGBA1C 5.5 10/04/2011    Lab Results  Component Value Date   VITAMINB12 780 10/04/2011     Imaging/Diagnostic Tests:  Dg Chest 2 View  10/04/2011 IMPRESSION: No active lung disease.  Mild cardiomegaly with minimally prominent pulmonary vascularity.   Ct Head Wo Contrast  10/04/2011 IMPRESSION: Negative exam.     Ct Angio Chest Pe W/cm &/or Wo Cm  10/04/2011 IMPRESSION:   No pulmonary emboli or acute abnormality.     Assessment/Plan: Edwin Tyler is a 51 y.o. year old male with PMH of Hepatitis C and uncontrolled HTN who presents with chest pain, SOB and recent unwitnessed syncopal episode.   # Chest pain - EKG revealed sinus tach and LVH. Patient does have significant risk factors - HTN and Family history of CAD.  - Cardiac enzymes negative x 3  - Cardiology following and we greatly appreciate their help/input. Nuclear stress test and Echo ordered. - Will continue to follow Cardiology recommendations  - Will continue sublingual nitro, ASA, Lopressor 50 mg  - TSH 1.171, Lipid panel unremarkable (at goal), A1C 5.5.  # SOB  - Elevated d-dimer, but CTA negative for PE - Will monitor closely with continuous Pulse ox   # Syncope  - Unsure of etiology. Event was unwitnessed. No reports of incontinence. Unlikely to be seizure activity.  - Basic laboratory work up unremarkable, orthostatics negative, EKG revealed no arrythmia  - Will continue to monitor on telemetry   #  Hepatitis C  - Patient underwent prior treatment in 2012 and has not followed up.  - Patient reports that he had undetectable viral load following treatment  - Awaiting HCV viral load - Will need outpatient follow up regarding Hep C.   # HTN  - Will continue Maxzide and Lopressor 50 mg BID  # Arm numbness and recent Aphasic event - TSH, RPR, B12 - Normal - Distribution of numbness is not consistent with focal lesion, no need for imaging  - Concern for TIA given recent aphasic event.  Will continue ASA 81 mg and control HTN.  Risk stratification labs unremarkable.  Awaiting echo today.  # Diarrhea,  Bright red blood per rectum  - Patient's Hemoglobin is stable at this time  - Hemocult negative  - Patient had 4 episodes of diarrhea last night.  Stool culture, C diff, and Lactoferrin ordered.  # Weight loss  - Patient reports recent nausea, poor appetite, and weight loss of approximately 15 lbs  - HIV ab obtained and was negative.  - Will consider GI consult and further workup, likely as outpatient.  FEN/GI: NS @ 100 mL/hr, Heart healthy diet  Prophylaxis: Lovenox SubQ  Disposition: Pending clinical improvement and further workup  Code Status: Full code    [name], DO 10/05/2011, 9:00 AM

## 2011-10-05 NOTE — Progress Notes (Signed)
INITIAL ADULT NUTRITION ASSESSMENT Date: 10/05/2011   Time: 2:49 PM  INTERVENTION:  Resource Breeze supplement 3 times daily (250 kcals, 9 gm protein per 8 fl oz carton) RD to follow for nutrition care plan  Reason for Assessment: Malnutrition Screening Tool Report  ASSESSMENT: Male 51 y.o.  Dx: loss of consciousness, syncope  Hx:  Past Medical History  Diagnosis Date  . Hypertension   . Hepatitis C   . GERD (gastroesophageal reflux disease)    Related Meds:     . aspirin  81 mg Oral Daily  . enoxaparin (LOVENOX) injection  40 mg Subcutaneous Q24H  . ibuprofen      . ibuprofen      . ibuprofen  600 mg Oral Once  . metoprolol tartrate  50 mg Oral BID  . metoprolol tartrate  50 mg Oral Once  . pantoprazole  40 mg Oral Q1200  . regadenoson      . regadenoson  0.4 mg Intravenous Once  . banana bag IV fluid 1000 mL   Intravenous Once  . sodium chloride  1,000 mL Intravenous Once  . sodium chloride  3 mL Intravenous Q12H  . triamterene-hydrochlorothiazide  1 each Oral Daily    Ht: 5\' 11"  (180.3 cm)  Wt: 221 lb 3.2 oz (100.336 kg)  Ideal Wt: 78.1 kg % Ideal Wt: 128%  Usual Wt: unable to obtain % Usual Wt: ---  Body mass index is 30.85 kg/(m^2).  Food/Nutrition Related Hx: recent weight loss without trying and decreased appetite per admission nutrition screen  Labs:  CMP     Component Value Date/Time   NA 137 10/05/2011 0556   K 4.0 10/05/2011 0556   CL 103 10/05/2011 0556   CO2 22 10/05/2011 0556   GLUCOSE 116* 10/05/2011 0556   BUN 15 10/05/2011 0556   CREATININE 0.73 10/05/2011 0556   CALCIUM 8.8 10/05/2011 0556   PROT 6.6 10/05/2011 0556   ALBUMIN 3.1* 10/05/2011 0556   AST 16 10/05/2011 0556   ALT 11 10/05/2011 0556   ALKPHOS 64 10/05/2011 0556   BILITOT 0.6 10/05/2011 0556   GFRNONAA >90 10/05/2011 0556   GFRAA >90 10/05/2011 0556     Intake/Output Summary (Last 24 hours) at 10/05/11 1450 Last data filed at 10/05/11 0500  Gross per 24 hour  Intake 914.58  ml  Output      0 ml  Net 914.58 ml    Diet Order: Cardiac  Supplements/Tube Feeding: N/A  IVF:    sodium chloride Last Rate: 1,000 mL (10/05/11 1225)  sodium chloride Last Rate: 1,000 mL (10/04/11 2142)    Estimated Nutritional Needs:   Kcal: 2200-2400 Protein: 110-120  gm Fluid: 2.2-2.4 L  Patient admitted with chest pain, SOB, and recent syncopal episode; RD spoke with patient's wife regarding nutrition hx; states patient was eating poorly for the past month; went 2 days without eating anything; reports patient has lost approximately 15 lb in this time frame; + diarrhea & nausea; no % PO intake recorded; would benefit from addition of nutrition supplements -- RD to order.  Patient meets criteria for severe malnutrition in the context of acute illness given < 50% of estimated energy requirement for > 5 days and 6% weight loss x 1 month.  NUTRITION DIAGNOSIS: -Unintentional Weight Loss  RELATED TO: inadequate oral intake & diarrhea  AS EVIDENCE BY: 6% weight loss x 1 month  MONITORING/EVALUATION(Goals): Goal: Oral intake with meals & supplements to meet >/= 90% of estimated nutrition needs Monitor:  PO & supplemental intake, weight, labs, I/O's  EDUCATION NEEDS: -No education needs identified at this time  Dietitian #: [phone]  DOCUMENTATION CODES Per approved criteria  -Severe malnutrition in the context of acute illness or injury -Obesity Unspecified    Alger Memos 10/05/2011, 2:49 PM

## 2011-10-05 NOTE — Progress Notes (Addendum)
  Echocardiogram 2D Echocardiogram has been performed.  Edwin Tyler 10/05/2011, 3:35 PM

## 2011-10-05 NOTE — Care Management Note (Unsigned)
    Page 1 of 2   10/08/2011     10:31:19 AM   CARE MANAGEMENT NOTE 10/08/2011  Patient:  Edwin Tyler   Account Number:  [id number]  Date Initiated:  10/05/2011  Documentation initiated by:  SIMMONS,Huxton Glaus  Subjective/Objective Assessment:   ADMITTED WITH SYNCOPE; LIVES AT HOME WITH GIRLFRIEND; WAS IPTA. OFF UNIT CURRENTLY.     Action/Plan:   DISCHARGE PLANNING INITIATED.   Anticipated DC Date:  10/06/2011   Anticipated DC Plan:  HOME/SELF CARE      DC Planning Services  CM consult  Medication Assistance      Choice offered to / List presented to:          [facility] arranged  HH-1 RN  HH-10 DISEASE MANAGEMENT      HH agency  [facility]   Status of service:  In process, will continue to follow Medicare Important Message given?   (If response is "NO", the following Medicare IM given date fields will be blank) Date Medicare IM given:   Date Additional Medicare IM given:    Discharge Disposition:    Per UR Regulation:  Reviewed for med. necessity/level of care/duration of stay  If discussed at Long Length of Stay Meetings, dates discussed:    Comments:  10/08/11  1027  Merik Mignano SIMMONS RN, BSN [phone] REFERRAL PLACED TO [facility]; REFERRAL PLACED TO [facility] FOR [facility]; SOC DATE: WITHIN 24-48HRS POST D/C; PT IS ELIGIBLE FOR MEDS FROM [facility]; MD- PLEASE WRITE SEPARATE RX FOR 3 DAYS SUPPLY AT DISCHARGE; NCM WILL FOLLOW.  10/05/11  1120 Feliza Diven SIMMONS RN, BSN [phone] NCM WILL FOLLOW.

## 2011-10-05 NOTE — Progress Notes (Signed)
Pt requesting Ibuprofen orders for headache. He states that, "Tylenol does not work for his HA." MD on call notified.

## 2011-10-06 LAB — CBC
HCT: 48.1 % (ref 39.0–52.0)
Hemoglobin: 17 g/dL (ref 13.0–17.0)
RBC: 5.04 MIL/uL (ref 4.22–5.81)

## 2011-10-06 LAB — BASIC METABOLIC PANEL
Chloride: 102 mEq/L (ref 96–112)
GFR calc Af Amer: >90 mL/min (ref >90)
GFR calc non Af Amer: >90 mL/min (ref >90)
Potassium: 3.9 mEq/L (ref 3.5–5.1)
Sodium: 136 mEq/L (ref 135–145)

## 2011-10-06 MED ORDER — LISINOPRIL 10 MG PO TABS
10.0000 mg | ORAL_TABLET | Freq: Every day | ORAL | Status: DC
  Administered 2011-10-07 – 2011-10-09 (×3): 10 mg via ORAL
  Filled 2011-10-06 (×3): qty 1

## 2011-10-06 NOTE — Progress Notes (Signed)
Pt admitted with recent syncope and sinus tachycardia.  Found to have newly diagnosed cardiomyopathy.   SUBJECTIVE: "A little" chest pain; dyspneic earlier   OBJECTIVE: Physical Exam: Filed Vitals:   10/05/11 1014 10/05/11 2141 10/06/11 0300 10/06/11 0438  BP: 122/87 122/91  108/77  Pulse: 115 112  96  Temp:  97.2 F (36.2 C)  98.2 F (36.8 C)  TempSrc:  Oral  Oral  Resp:  20  18  Height:      Weight:   218 lb 14.7 oz (99.3 kg)   SpO2:  97%  98%    Intake/Output Summary (Last 24 hours) at 10/06/11 1041 Last data filed at 10/06/11 0819  Gross per 24 hour  Intake   1920 ml  Output   1650 ml  Net    270 ml    Telemetry reveals sinus tachycardia, no arrhythmias  GEN- WD, WN, NAD   Head- normal Neck- supple Lungs- Clear to ausculation bilaterally, normal work of breathing Heart- RRR GI- soft, NT, ND, + BS Extremities- no edema Psych- anxious mood Neuro- strength and sensation are intact  LABS: Basic Metabolic Panel:  Basename 10/06/11 0907 10/05/11 0556  NA 136 137  K 3.9 4.0  CL 102 103  CO2 26 22  GLUCOSE 122* 116*  BUN 13 15  CREATININE 0.78 0.73  CALCIUM 9.2 8.8  MG -- --  PHOS -- --   Liver Function Tests:  [facility] 10/05/11 0556 10/04/11 1111  AST 16 19  ALT 11 14  ALKPHOS 64 78  BILITOT 0.6 1.5*  PROT 6.6 7.5  ALBUMIN 3.1* 3.7   CBC:  Basename 10/06/11 0907 10/05/11 0556 10/04/11 1111  WBC 5.6 10.7* --  NEUTROABS -- -- 8.3*  HGB 17.0 17.3* --  HCT 48.1 49.0 --  MCV 95.4 94.8 --  PLT 146* 157 --   Cardiac Enzymes:  Basename 10/04/11 2357 10/04/11 1745 10/04/11 1111  CKTOTAL 50 53 58  CKMB 3.0 3.0 2.9  CKMBINDEX -- -- --  TROPONINI <0.30 <0.30 <0.30   D-Dimer:  Basename 10/04/11 1512  DDIMER 0.60*   Hemoglobin A1C:  Basename 10/04/11 1512  HGBA1C 5.5   Fasting Lipid Panel:  Basename 10/05/11 0555  CHOL 125  HDL 55  LDLCALC 54  TRIG 80  CHOLHDL 2.3  LDLDIRECT --   Thyroid Function Tests:  Basename  10/04/11 2121  TSH 1.171  T4TOTAL --  T3FREE --  THYROIDAB --   Anemia Panel:  Basename 10/04/11 1745  VITAMINB12 780  FOLATE --  FERRITIN --  TIBC --  IRON --  RETICCTPCT --    RADIOLOGY: Dg Chest 2 View  10/04/2011  *RADIOLOGY REPORT*  Clinical Data: Loss of consciousness, left chest and arm pain  CHEST - 2 VIEW  Comparison: None.  Findings: No active infiltrate or effusion is seen.  There is minimal fullness of the perihilar vasculature.  The heart is mildly enlarged.  No acute bony abnormality is seen.  IMPRESSION: No active lung disease.  Mild cardiomegaly with minimally prominent pulmonary vascularity.   Original Report Authenticated By: Juline Patch, M.D.    Ct Head Wo Contrast  10/04/2011  *RADIOLOGY REPORT*  Clinical Data: Loss of consciousness.  Syncope.  CT HEAD WITHOUT CONTRAST  Technique:  Contiguous axial images were obtained from the base of the skull through the vertex without contrast.  Comparison: None.  Findings: The brain has a normal appearance without evidence for hemorrhage, infarction, hydrocephalus, or mass lesion.  There  is no extra axial fluid collection.  The skull and paranasal sinuses are normal.  IMPRESSION: Negative exam.   Original Report Authenticated By: Rosealee Albee, M.D.    Ct Angio Chest Pe W/cm &/or Wo Cm  10/04/2011  *RADIOLOGY REPORT*  Clinical Data: Chest pain, shortness of breath and recent syncopal episode.  Dizziness prior to the syncopal episode.  CT ANGIOGRAPHY CHEST  Technique:  Multidetector CT imaging of the chest using the standard protocol during bolus administration of intravenous contrast. Multiplanar reconstructed images including MIPs were obtained and reviewed to evaluate the vascular anatomy.  Contrast: OMNIPAQUE IOHEXOL 350 MG/ML SOLN  Comparison: Chest radiographs obtained earlier today.  Findings: Normally opacified pulmonary arteries with no pulmonary arterial filling defects seen.  Clear lungs.  No lung masses or  enlarged lymph nodes.  Thoracic spine degenerative changes. Unremarkable upper abdomen.  IMPRESSION:   No pulmonary emboli or acute abnormality.   Original Report Authenticated By: Darrol Angel, M.D.    Nm Myocar Multi W/spect W/wall Motion / Ef  10/05/2011  The patient underwent stress testing with Lexiscan protocol under supervision of cardiology staff. EKG at rest shows LVH with strain pattern which persists unchanged during and after stress. Patient experience dyspnea and diaphoresis with stress. No chest pain.  The quality of the images is fair.  The images are degraded somewhat by motion artefact.  Perfusion images demonstrate a small fixed defect of the midanteroseptal wall.  There is no significant reversibility. Diaphramatic attenuation is present at the base of rest and stress images.  The left ventricle is dilated.  The EDV is 275 ml.  The ESV is 218 ml.  The calculated ejection fraction is markedly reduced at 21%. There is severe global hypokinesis.  Impression: Abnormal Lexiscan Myoview with old small midanteroseptal scar and with severe LV dilatation and global hypokinesis.  EF 21%.  Thomas A. Brackbill MD   Original Report Authenticated By: Heriberto Antigua     ASSESSMENT AND PLAN:  Active Problems:  Syncope  Chest pain  Numbness  Weight loss  Dizziness  Hypertension  Chills  Acute systolic CHF (congestive heart failure)  Tachycardia  1. Acute systolic dysfunction EF is newly depressed.  This is of an unclear etiology.  Plan cath on Monday to exclude CAD. He admits to heavy ETOH previously. Lifestyle modification is advised. - Continue coreg - Change lisinopril to 10 mg po daily.  2. Syncope- Etiology unclear although with reduced EF arrhythmia possible. Dr Johney Frame feels not presently a candidate for an ICD.  Plan aggressive medical management and repeat EF in 3-6 months before making this decision (unless he has significant arrhythmias observed in the hospital).  Plan LifeVest placed  at discharge.  Cardiology can arrange this early next week. He should be instructed to not drive for 6 months following his syncopal event.  Olga Millers, MD 10/06/2011 10:41 AM

## 2011-10-06 NOTE — Progress Notes (Signed)
Family Medicine Teaching Service Daily Progress Note Service Page: [phone]  Subjective:  Continues to report SOB.  Also had some chest discomfort last night. Diarrhea seems to be improving.  Patient had soft, formed stool last night.  Objective: Temp:  [97.2 F (36.2 C)-98.2 F (36.8 C)] 98.2 F (36.8 C) (08/24 0438) Pulse Rate:  [96-112] 96  (08/24 0438) Resp:  [18-20] 18  (08/24 0438) BP: (108-122)/(77-91) 108/77 mmHg (08/24 0438) SpO2:  [97 %-98 %] 98 % (08/24 0438) Weight:  [218 lb 14.7 oz (99.3 kg)] 218 lb 14.7 oz (99.3 kg) (08/24 0300)  Exam: General: awake, alert, NAD. Cardiovascular: Tachcardic. No murmurs, rubs, or gallops. Respiratory: Bibasilar rales noted. Abdomen: soft, nontender, nondistended. +BS. Extremities: no edema noted.   I have reviewed the patient's medications, labs, imaging, and diagnostic testing.  Notable results are summarized below.  CBC BMET   Lab 10/06/11 0907 10/05/11 0556 10/04/11 2121  WBC 5.6 10.7* 9.3  HGB 17.0 17.3* 17.6*  HCT 48.1 49.0 49.6  PLT 146* 157 176    Lab 10/06/11 0907 10/05/11 0556 10/04/11 2121 10/04/11 1111  NA 136 137 -- 139  K 3.9 4.0 -- 4.3  CL 102 103 -- 102  CO2 26 22 -- 26  BUN 13 15 -- 14  CREATININE 0.78 0.73 0.77 --  GLUCOSE 122* 116* -- 130*  CALCIUM 9.2 8.8 -- 9.6     Cardiac Panel (last 3 results)  Basename 10/04/11 2357 10/04/11 1745 10/04/11 1111  CKTOTAL 50 53 58  CKMB 3.0 3.0 2.9  TROPONINI <0.30 <0.30 <0.30  RELINDX RELATIVE INDEX IS INVALID RELATIVE INDEX IS INVALID RELATIVE INDEX IS INVALID   Lipid Panel     Component Value Date/Time   CHOL 125 10/05/2011 0555   TRIG 80 10/05/2011 0555   HDL 55 10/05/2011 0555   CHOLHDL 2.3 10/05/2011 0555   VLDL 16 10/05/2011 0555   LDLCALC 54 10/05/2011 0555   Lab Results  Component Value Date   HGBA1C 5.5 10/04/2011    Lab Results  Component Value Date   VITAMINB12 780 10/04/2011   Imaging/Diagnostic Tests:  Ct Angio Chest Pe W/cm &/or Wo  Cm  10/04/2011 IMPRESSION:   No pulmonary emboli or acute abnormality.    Nm Myocar Multi W/spect W/wall Motion / Ef  10/05/2011  Impression: Abnormal Lexiscan Myoview with old small midanteroseptal scar and with severe LV dilatation and global hypokinesis.  EF 21%.    Echo  Study Conclusions: - Left ventricle: The cavity size was moderately dilated. Wall thickness was increased in a pattern of mild LVH. Systolic function was severely reduced. The estimated ejection fraction was in the range of 25% to 30%. Diffuse hypokinesis. Doppler parameters are consistent with high ventricular filling pressure. - Mitral valve: Mild regurgitation. - Left atrium: The atrium was severely dilated.  Assessment/Plan: GRASON BRAILSFORD is a 51 y.o. year old male with PMH of Hepatitis C and uncontrolled HTN who presents with chest pain, SOB and recent unwitnessed syncopal episode.   # Chest pain - EKG revealed sinus tach and LVH. Patient does have significant risk factors - HTN and Family history of CAD.  - Cardiac enzymes negative x 3  - Cardiology following and we greatly appreciate their help/input.  - Will continue sublingual nitro, ASA; Cardiology switched metoprolol to Coreg and added Lisinopril. - TSH 1.171, Lipid panel unremarkable (at goal), A1C 5.5. - Echo and Nuclear stress obtained yesterday.  Decreased EF of 25-30%, diffuse hypokinesis, LV dilation, severely dilated  left atrium, old midanteroseptal scar. - Cardiology schedule Cath for Monday.  # SOB  - Elevated d-dimer, but CTA negative for PE - Will monitor closely with continuous Pulse ox  - Likely secondary to low EF.  # Syncope  - Unsure of etiology. Event was unwitnessed. No reports of incontinence. Unlikely to be seizure activity.  - Basic laboratory work up unremarkable, orthostatics negative, EKG revealed no arrythmia  - Will continue to monitor on telemetry  - Cardiology recommended LifeVest at discharge  # Hepatitis C  -  Patient underwent prior treatment in 2012 and has not followed up.  - Patient reports that he had undetectable viral load following treatment  - Awaiting HCV viral load - Will need outpatient follow up regarding Hep C.   # HTN  - Continue Coreg and Lisinopril.  # Arm numbness and recent Aphasic event - TSH, RPR, B12 - Normal - Distribution of numbness is not consistent with focal lesion, no need for imaging  - Concern for TIA given recent aphasic event.  Will continue ASA 81 mg and control HTN.  Risk stratification labs unremarkable. - Will continue to monitor  # Diarrhea, Bright red blood per rectum  - Patient's Hemoglobin is stable at this time  - Hemocult negative  - C diff = negative.  Awaiting stool culture and lactoferrin.  # Weight loss  - Patient reports recent nausea, poor appetite, and weight loss of approximately 15 lbs  - HIV ab obtained and was negative.  - Will consider GI consult and further workup, likely as outpatient.  FEN/GI: NS @ 100 mL/hr, Heart healthy diet  Prophylaxis: Lovenox SubQ  Disposition: Pending clinical improvement and further workup  Code Status: Full code    [name], DO 10/06/2011, 10:27 AM

## 2011-10-06 NOTE — Progress Notes (Signed)
Patient need to talk with someone in CSW, worried about hospital bills as he has no insurance.

## 2011-10-06 NOTE — Progress Notes (Signed)
Patient states "he is worried" about his health and bills at home. Seems very anxious. Will cont. To monitor.

## 2011-10-06 NOTE — Progress Notes (Signed)
Seen and examined.  Feels better this morning.  Less chest discomfort, less diarrhea, less tearful.  Primary problem is new systolic dysfunction CHF.  Cards plans cath in two days (Monday) to investigate possibility of ischemic cardiomyopathy.  Agree with Dr. Patsey Berthold management and documentation.

## 2011-10-06 NOTE — Progress Notes (Signed)
C diff negative- contact isolation discontinued.

## 2011-10-07 ENCOUNTER — Encounter (HOSPITAL_COMMUNITY): Payer: Self-pay | Admitting: Nurse Practitioner

## 2011-10-07 DIAGNOSIS — I428 Other cardiomyopathies: Secondary | ICD-10-CM

## 2011-10-07 DIAGNOSIS — R000 Tachycardia, unspecified: Secondary | ICD-10-CM

## 2011-10-07 DIAGNOSIS — I429 Cardiomyopathy, unspecified: Secondary | ICD-10-CM

## 2011-10-07 LAB — BASIC METABOLIC PANEL
BUN: 16 mg/dL (ref 6–23)
Chloride: 100 mEq/L (ref 96–112)
Creatinine, Ser: 0.84 mg/dL (ref 0.50–1.35)
GFR calc Af Amer: >90 mL/min (ref >90)
GFR calc non Af Amer: >90 mL/min (ref >90)
Glucose, Bld: 118 mg/dL — ABNORMAL HIGH (ref 70–99)

## 2011-10-07 LAB — FECAL LACTOFERRIN, QUANT: Fecal Lactoferrin: NEGATIVE

## 2011-10-07 MED ORDER — HYDROCODONE-ACETAMINOPHEN 5-325 MG PO TABS
1.0000 | ORAL_TABLET | Freq: Every day | ORAL | Status: DC | PRN
  Administered 2011-10-07 – 2011-10-08 (×2): 1 via ORAL
  Filled 2011-10-07 (×2): qty 1

## 2011-10-07 MED ORDER — SODIUM CHLORIDE 0.9 % IJ SOLN: 3.0000 mL | Freq: Two times a day (BID) | INTRAMUSCULAR | Status: DC

## 2011-10-07 MED ORDER — SODIUM CHLORIDE 0.9 % IV SOLN
INTRAVENOUS | Status: DC
  Administered 2011-10-08: 06:00:00 via INTRAVENOUS

## 2011-10-07 MED ORDER — ASPIRIN 81 MG PO CHEW
324.0000 mg | CHEWABLE_TABLET | ORAL | Status: AC
  Administered 2011-10-08: 324 mg via ORAL
  Filled 2011-10-07: qty 4

## 2011-10-07 MED ORDER — SODIUM CHLORIDE 0.9 % IV SOLN: 250.0000 mL | INTRAVENOUS | Status: DC | PRN

## 2011-10-07 MED ORDER — KETOROLAC TROMETHAMINE 30 MG/ML IJ SOLN
30.0000 mg | Freq: Once | INTRAMUSCULAR | Status: AC
  Administered 2011-10-07: 30 mg via INTRAVENOUS
  Filled 2011-10-07: qty 1

## 2011-10-07 MED ORDER — DIAZEPAM 2 MG PO TABS
2.0000 mg | ORAL_TABLET | ORAL | Status: AC
  Administered 2011-10-08: 2 mg via ORAL
  Filled 2011-10-07: qty 1

## 2011-10-07 MED ORDER — SODIUM CHLORIDE 0.9 % IJ SOLN: 3.0000 mL | INTRAMUSCULAR | Status: DC | PRN

## 2011-10-07 NOTE — Progress Notes (Signed)
Family Medicine Teaching Service Daily Progress Note Service Page: [phone]  Subjective:  Coreg held for low BP this am.  BP Readings from Last 3 Encounters:  10/07/11 93/50   Pt c/o headache yesterday, relieved by norco which seems to be a home medication. Requests a faster acting depression medication to take. Had one episode chest pain resolved by itself. Cards planning on cath tomorrow.  Objective: Temp:  [97.5 F (36.4 C)-97.7 F (36.5 C)] 97.5 F (36.4 C) (08/25 0407) Pulse Rate:  [90-102] 90  (08/25 0407) Resp:  [18-20] 20  (08/25 0407) BP: (93-112)/(50-78) 93/50 mmHg (08/25 0407) SpO2:  [98 %-100 %] 98 % (08/25 0407) Weight:  [218 lb 4.8 oz (99.02 kg)] 218 lb 4.8 oz (99.02 kg) (08/25 0407)  Exam: General: awake, alert, NAD. Cardiovascular:  No murmurs, rubs, or gallops. Respiratory: Bibasilar rales noted. Abdomen: soft, nontender, nondistended. +BS. Extremities: no edema noted.   I have reviewed the patient's medications, labs, imaging, and diagnostic testing.  Notable results are summarized below.  CBC BMET   Lab 10/06/11 0907 10/05/11 0556 10/04/11 2121  WBC 5.6 10.7* 9.3  HGB 17.0 17.3* 17.6*  HCT 48.1 49.0 49.6  PLT 146* 157 176    Lab 10/07/11 0604 10/06/11 0907 10/05/11 0556  NA 136 136 137  K 4.6 3.9 4.0  CL 100 102 103  CO2 29 26 22   BUN 16 13 15   CREATININE 0.84 0.78 0.73  GLUCOSE 118* 122* 116*  CALCIUM 9.3 9.2 8.8     Cardiac Panel (last 3 results)  Basename 10/04/11 2357 10/04/11 1745 10/04/11 1111  CKTOTAL 50 53 58  CKMB 3.0 3.0 2.9  TROPONINI <0.30 <0.30 <0.30  RELINDX RELATIVE INDEX IS INVALID RELATIVE INDEX IS INVALID RELATIVE INDEX IS INVALID   Lipid Panel     Component Value Date/Time   CHOL 125 10/05/2011 0555   TRIG 80 10/05/2011 0555   HDL 55 10/05/2011 0555   CHOLHDL 2.3 10/05/2011 0555   VLDL 16 10/05/2011 0555   LDLCALC 54 10/05/2011 0555   Lab Results  Component Value Date   HGBA1C 5.5 10/04/2011    Lab Results    Component Value Date   VITAMINB12 780 10/04/2011   Imaging/Diagnostic Tests:  Ct Angio Chest Pe W/cm &/or Wo Cm  10/04/2011 IMPRESSION:   No pulmonary emboli or acute abnormality.    Nm Myocar Multi W/spect W/wall Motion / Ef  10/05/2011  Impression: Abnormal Lexiscan Myoview with old small midanteroseptal scar and with severe LV dilatation and global hypokinesis.  EF 21%.    Echo  Study Conclusions: - Left ventricle: The cavity size was moderately dilated. Wall thickness was increased in a pattern of mild LVH. Systolic function was severely reduced. The estimated ejection fraction was in the range of 25% to 30%. Diffuse hypokinesis. Doppler parameters are consistent with high ventricular filling pressure. - Mitral valve: Mild regurgitation. - Left atrium: The atrium was severely dilated.  Assessment/Plan: Edwin Tyler is a 51 y.o. year old male with PMH of Hepatitis C and uncontrolled HTN and possible alcoholism, who presents with chest pain, SOB and recent unwitnessed syncopal episode with severe cardiomyopathy.  # Chest pain/cardiomyopathy - EKG revealed sinus tach and LVH. Patient does have significant risk factors - HTN and Family history of CAD. Echo and Nuclear stress showed EF of 25-30%, diffuse hypokinesis, LV dilation, severely dilated left atrium, old midanteroseptal scar. - Cardiac enzymes negative x 3  - Cardiology following and we greatly appreciate their help/input.  -  Will continue sublingual nitro, ASA; Cardiology switched metoprolol to Coreg and added Lisinopril. - Cardiology scheduled Cath for Monday.  # SOB, stable, no hypoxia. Likely secondary to low EF. - Elevated d-dimer, but CTA negative for PE - Will monitor closely with continuous Pulse ox  -Monitor volume status, daily weights.  # Syncope-no arrythmias captured on tele, likely cardiac source with EF.  - Basic laboratory work up unremarkable, orthostatics negative - Will continue to monitor on  telemetry  - Cardiology recommended LifeVest at discharge  # Hepatitis C  - Patient underwent prior treatment in 2012 and has not followed up.  - Patient reports that he had undetectable viral load following treatment  - Awaiting HCV viral load - Will need outpatient follow up regarding Hep C.   # HTN  - Continue Coreg and Lisinopril. May not tolerate higher doses with soft BP.   # Arm numbness and recent Aphasic event -possible TIA, continue aspirin.  # Diarrhea, Bright red blood per rectum  - Patient's Hemoglobin is stable at this time  - Hemocult negative  - C diff = negative.  Awaiting stool culture and lactoferrin.  # Weight loss  - Patient reports recent nausea, poor appetite, and weight loss of approximately 15 lbs  - HIV ab obtained and was negative.  - Will consider GI consult and further workup, likely as outpatient.  #depression/anxiety -started celexa this admission -does not seem like a low risk candidate for BZDs, especially not long term.  #headaches -would try to wean vicodin, maybe rebound headache from overuse.   FEN/GI: NS @ 100 mL/hr, Heart healthy diet  Prophylaxis: Lovenox SubQ  Disposition: Pending clinical improvement and further workup  Code Status: Full code   Lloyd Huger, MD PGY-3 10/07/2011, 7:08 AM [phone]

## 2011-10-07 NOTE — Progress Notes (Signed)
Chaplain visited with patient after RN paged at pt. request.  Pt. Expressed great concern over seriousness of his current situation as told by doctors.  Pt. Shared relationship problems at length, as well as continued sources of stress.  Chaplain listened at length and encouraged pt. To reduce stress if at all possible.  Spiritual encouragement and conversation, prayer.  Pt. Expressed appreciation for chaplain support.  Rev. Audie Box, chaplain [phone]

## 2011-10-07 NOTE — Progress Notes (Signed)
 Patient Name: Edwin Tyler Date of Encounter: 10/07/2011     Principal Problem:  *Syncope Active Problems:  Chest pain  Hypertension  Sinus tachycardia  Cardiomyopathy (newly Dx - ? ICM vs. NICM)   SUBJECTIVE  Had some chest pain yesterday along with headache.  Both relieved with vicodin.  No complaints this AM.  CURRENT MEDS    . aspirin  81 mg Oral Daily  . carvedilol  6.25 mg Oral BID WC  . citalopram  20 mg Oral Daily  . enoxaparin (LOVENOX) injection  40 mg Subcutaneous Q24H  . feeding supplement  1 Container Oral TID BM  . lisinopril  10 mg Oral Daily  . pantoprazole  40 mg Oral Q1200  . banana bag IV fluid 1000 mL   Intravenous Once  . sodium chloride  3 mL Intravenous Q12H  . DISCONTD: lisinopril  5 mg Oral Daily    OBJECTIVE  Filed Vitals:   10/06/11 0438 10/06/11 1439 10/06/11 2026 10/07/11 0407  BP: 108/77 112/78 96/63 93/50  Pulse: 96 102 97 90  Temp: 98.2 F (36.8 C) 97.7 F (36.5 C) 97.7 F (36.5 C) 97.5 F (36.4 C)  TempSrc: Oral Oral Oral Oral  Resp: 18 18 19 20  Height:      Weight:    218 lb 4.8 oz (99.02 kg)  SpO2: 98% 100% 100% 98%    Intake/Output Summary (Last 24 hours) at 10/07/11 0856 Last data filed at 10/07/11 0431  Gross per 24 hour  Intake    480 ml  Output    450 ml  Net     30 ml   Filed Weights   10/05/11 0536 10/06/11 0300 10/07/11 0407  Weight: 221 lb 3.2 oz (100.336 kg) 218 lb 14.7 oz (99.3 kg) 218 lb 4.8 oz (99.02 kg)   PHYSICAL EXAM  General: Pleasant, NAD. Neuro: Alert and oriented X 3. Moves all extremities spontaneously. Psych: Normal affect. HEENT:  Normal  Neck: Supple without bruits or JVD. Lungs:  Resp regular and unlabored, CTA. Heart: RRR, tachy no s3, s4, or murmurs. Abdomen: Soft, non-tender, non-distended, BS + x 4.  Extremities: No clubbing, cyanosis or edema. DP/PT/Radials 2+ and equal bilaterally.  Accessory Clinical Findings  CBC  Basename 10/06/11 0907 10/05/11 0556 10/04/11 1111    WBC 5.6 10.7* --  NEUTROABS -- -- 8.3*  HGB 17.0 17.3* --  HCT 48.1 49.0 --  MCV 95.4 94.8 --  PLT 146* 157 --   Basic Metabolic Panel  Basename 10/07/11 0604 10/06/11 0907  NA 136 136  K 4.6 3.9  CL 100 102  CO2 29 26  GLUCOSE 118* 122*  BUN 16 13  CREATININE 0.84 0.78  CALCIUM 9.3 9.2  MG -- --  PHOS -- --   Liver Function Tests  Basename 10/05/11 0556 10/04/11 1111  AST 16 19  ALT 11 14  ALKPHOS 64 78  BILITOT 0.6 1.5*  PROT 6.6 7.5  ALBUMIN 3.1* 3.7   Cardiac Enzymes  Basename 10/04/11 2357 10/04/11 1745 10/04/11 1111  CKTOTAL 50 53 58  CKMB 3.0 3.0 2.9  CKMBINDEX -- -- --  TROPONINI <0.30 <0.30 <0.30    D-Dimer  Basename 10/04/11 1512  DDIMER 0.60*   Hemoglobin A1C  Basename 10/04/11 1512  HGBA1C 5.5   Fasting Lipid Panel  Basename 10/05/11 0555  CHOL 125  HDL 55  LDLCALC 54  TRIG 80  CHOLHDL 2.3  LDLDIRECT --   Thyroid Function Tests  Basename 10/04/11   2121  TSH 1.171  T4TOTAL --  T3FREE --  THYROIDAB --    TELE  Rsr  Radiology/Studies  Nm Myocar Multi W/spect W/wall Motion / Ef  10/05/2011  The patient underwent stress testing with Lexiscan protocol under supervision of cardiology staff. EKG at rest shows LVH with strain pattern which persists unchanged during and after stress. Patient experience dyspnea and diaphoresis with stress. No chest pain.  The quality of the images is fair.  The images are degraded somewhat by motion artefact.  Perfusion images demonstrate a small fixed defect of the midanteroseptal wall.  There is no significant reversibility. Diaphramatic attenuation is present at the base of rest and stress images.  The left ventricle is dilated.  The EDV is 275 ml.  The ESV is 218 ml.  The calculated ejection fraction is markedly reduced at 21%. There is severe global hypokinesis.  Impression: Abnormal Lexiscan Myoview with old small midanteroseptal scar and with severe LV dilatation and global hypokinesis.  EF 21%.   Thomas A. Brackbill MD   Original Report Authenticated By: [email or web address]     ASSESSMENT AND PLAN  1. Syncope:  In setting of new finding of cardiomyopathy.  No events on tele.  Cont bb.  Plan lifevest pending cath.  2.  Cardiomyopathy:  ICM vs NICM (hx of heavy etoh).  No signif reversibility on myoview.  Cath tomorrow.  Cont bb/acei.  HR remains elevated however bp's have been soft - 90's this AM, thus will not push bb further @ this time.  Consider Digoxin.  Signed, Christopher Berge NP As above, patient seen and examined; continue present cardiac meds for LV dysfunction; DC ETOH; proceed with  R and L cath in AM; risks and benefits discussed and patient agrees to proceed; life vest at DC and repeat echo 3 months; if EF <35, ICD. Julieann Drummonds  

## 2011-10-07 NOTE — Progress Notes (Signed)
Seen and examined.  He seems much more settled than previously.  No major complaints.  For cath in am.  Agree with documentation and management by Dr. Cristal Ford.

## 2011-10-08 ENCOUNTER — Encounter (HOSPITAL_COMMUNITY)
Admission: EM | Disposition: A | Discharge status: Home / Self Care | Payer: Self-pay | Source: Home / Self Care | Attending: Family Medicine

## 2011-10-08 DIAGNOSIS — I428 Other cardiomyopathies: Secondary | ICD-10-CM

## 2011-10-08 HISTORY — PX: LEFT AND RIGHT HEART CATHETERIZATION WITH CORONARY ANGIOGRAM: SHX5449

## 2011-10-08 LAB — CBC
HCT: 48.2 % (ref 39.0–52.0)
MCH: 33.7 pg (ref 26.0–34.0)
MCHC: 35.5 g/dL (ref 30.0–36.0)
RDW: 12.6 % (ref 11.5–15.5)

## 2011-10-08 LAB — POCT I-STAT 3, VENOUS BLOOD GAS (G3P V)
Bicarbonate: 23 mEq/L (ref 20.0–24.0)
pH, Ven: 7.306 — ABNORMAL HIGH (ref 7.250–7.300)

## 2011-10-08 LAB — PROTIME-INR: INR: 1.12 (ref 0.00–1.49)

## 2011-10-08 LAB — BASIC METABOLIC PANEL
Calcium: 9.6 mg/dL (ref 8.4–10.5)
Creatinine, Ser: 0.91 mg/dL (ref 0.50–1.35)
GFR calc non Af Amer: >90 mL/min (ref >90)
Glucose, Bld: 116 mg/dL — ABNORMAL HIGH (ref 70–99)
Sodium: 136 mEq/L (ref 135–145)

## 2011-10-08 LAB — HCV RNA QUANT: HCV Quantitative: NOT DETECTED [IU]/mL — ABNORMAL LOW

## 2011-10-08 LAB — POCT I-STAT 3, ART BLOOD GAS (G3+)
O2 Saturation: 91 %
pCO2 arterial: 40.7 mmHg (ref 35.0–45.0)
pH, Arterial: 7.388 (ref 7.350–7.450)
pO2, Arterial: 63 mmHg — ABNORMAL LOW (ref 80.0–100.0)

## 2011-10-08 SURGERY — LEFT AND RIGHT HEART CATHETERIZATION WITH CORONARY ANGIOGRAM
Anesthesia: LOCAL

## 2011-10-08 MED ORDER — LISINOPRIL 10 MG PO TABS: 10.0000 mg | ORAL_TABLET | Freq: Every day | ORAL | Status: AC

## 2011-10-08 MED ORDER — LIDOCAINE HCL (PF) 1 % IJ SOLN
INTRAMUSCULAR | Status: AC
  Filled 2011-10-08: qty 30

## 2011-10-08 MED ORDER — MIDAZOLAM HCL 2 MG/2ML IJ SOLN
INTRAMUSCULAR | Status: AC
  Filled 2011-10-08: qty 2

## 2011-10-08 MED ORDER — ASPIRIN 81 MG PO CHEW: 81.0000 mg | CHEWABLE_TABLET | Freq: Every day | ORAL | Status: AC

## 2011-10-08 MED ORDER — HEPARIN (PORCINE) IN NACL 2-0.9 UNIT/ML-% IJ SOLN
INTRAMUSCULAR | Status: AC
  Filled 2011-10-08: qty 1000

## 2011-10-08 MED ORDER — FENTANYL CITRATE 0.05 MG/ML IJ SOLN
INTRAMUSCULAR | Status: AC
  Filled 2011-10-08: qty 2

## 2011-10-08 MED ORDER — CARVEDILOL 6.25 MG PO TABS: 6.2500 mg | ORAL_TABLET | Freq: Two times a day (BID) | ORAL | Status: AC

## 2011-10-08 MED ORDER — HYDROCODONE-ACETAMINOPHEN 5-325 MG PO TABS
1.0000 | ORAL_TABLET | Freq: Four times a day (QID) | ORAL | Status: DC | PRN
  Administered 2011-10-08 – 2011-10-09 (×3): 1 via ORAL
  Filled 2011-10-08 (×3): qty 1

## 2011-10-08 MED ORDER — PANTOPRAZOLE SODIUM 40 MG PO TBEC: 40.0000 mg | DELAYED_RELEASE_TABLET | Freq: Every day | ORAL | Status: DC

## 2011-10-08 MED ORDER — NITROGLYCERIN 0.2 MG/ML ON CALL CATH LAB
INTRAVENOUS | Status: AC
  Filled 2011-10-08: qty 1

## 2011-10-08 MED ORDER — SODIUM CHLORIDE 0.9 % IV SOLN: INTRAVENOUS | Status: AC

## 2011-10-08 MED ORDER — CITALOPRAM HYDROBROMIDE 20 MG PO TABS: 20.0000 mg | ORAL_TABLET | Freq: Every day | ORAL | Status: DC

## 2011-10-08 NOTE — Progress Notes (Signed)
[facility]MT[facility] Attending Daily Note: Tessica Cupo MD [phone] pager office [phone] I have discussed this patient with the resident and reviewed the assessment and plan as documented above. I agree wit the resident's findings and plan.  

## 2011-10-08 NOTE — H&P (View-Only) (Signed)
Patient Name: Edwin Tyler Date of Encounter: 10/07/2011     Principal Problem:  *Syncope Active Problems:  Chest pain  Hypertension  Sinus tachycardia  Cardiomyopathy (newly Dx - ? ICM vs. NICM)   SUBJECTIVE  Had some chest pain yesterday along with headache.  Both relieved with vicodin.  No complaints this AM.  CURRENT MEDS    . aspirin  81 mg Oral Daily  . carvedilol  6.25 mg Oral BID WC  . citalopram  20 mg Oral Daily  . enoxaparin (LOVENOX) injection  40 mg Subcutaneous Q24H  . feeding supplement  1 Container Oral TID BM  . lisinopril  10 mg Oral Daily  . pantoprazole  40 mg Oral Q1200  . banana bag IV fluid 1000 mL   Intravenous Once  . sodium chloride  3 mL Intravenous Q12H  . DISCONTD: lisinopril  5 mg Oral Daily    OBJECTIVE  Filed Vitals:   10/06/11 0438 10/06/11 1439 10/06/11 2026 10/07/11 0407  BP: 108/77 112/78 96/63 93/50   Pulse: 96 102 97 90  Temp: 98.2 F (36.8 C) 97.7 F (36.5 C) 97.7 F (36.5 C) 97.5 F (36.4 C)  TempSrc: Oral Oral Oral Oral  Resp: 18 18 19 20   Height:      Weight:    218 lb 4.8 oz (99.02 kg)  SpO2: 98% 100% 100% 98%    Intake/Output Summary (Last 24 hours) at 10/07/11 0856 Last data filed at 10/07/11 0431  Gross per 24 hour  Intake    480 ml  Output    450 ml  Net     30 ml   Filed Weights   10/05/11 0536 10/06/11 0300 10/07/11 0407  Weight: 221 lb 3.2 oz (100.336 kg) 218 lb 14.7 oz (99.3 kg) 218 lb 4.8 oz (99.02 kg)   PHYSICAL EXAM  General: Pleasant, NAD. Neuro: Alert and oriented X 3. Moves all extremities spontaneously. Psych: Normal affect. HEENT:  Normal  Neck: Supple without bruits or JVD. Lungs:  Resp regular and unlabored, CTA. Heart: RRR, tachy no s3, s4, or murmurs. Abdomen: Soft, non-tender, non-distended, BS + x 4.  Extremities: No clubbing, cyanosis or edema. DP/PT/Radials 2+ and equal bilaterally.  Accessory Clinical Findings  CBC  Basename 10/06/11 0907 10/05/11 0556 10/04/11 1111    WBC 5.6 10.7* --  NEUTROABS -- -- 8.3*  HGB 17.0 17.3* --  HCT 48.1 49.0 --  MCV 95.4 94.8 --  PLT 146* 157 --   Basic Metabolic Panel  Basename 10/07/11 0604 10/06/11 0907  NA 136 136  K 4.6 3.9  CL 100 102  CO2 29 26  GLUCOSE 118* 122*  BUN 16 13  CREATININE 0.84 0.78  CALCIUM 9.3 9.2  MG -- --  PHOS -- --   Liver Function Tests  Basename 10/05/11 0556 10/04/11 1111  AST 16 19  ALT 11 14  ALKPHOS 64 78  BILITOT 0.6 1.5*  PROT 6.6 7.5  ALBUMIN 3.1* 3.7   Cardiac Enzymes  Basename 10/04/11 2357 10/04/11 1745 10/04/11 1111  CKTOTAL 50 53 58  CKMB 3.0 3.0 2.9  CKMBINDEX -- -- --  TROPONINI <0.30 <0.30 <0.30    D-Dimer  Basename 10/04/11 1512  DDIMER 0.60*   Hemoglobin A1C  Basename 10/04/11 1512  HGBA1C 5.5   Fasting Lipid Panel  Basename 10/05/11 0555  CHOL 125  HDL 55  LDLCALC 54  TRIG 80  CHOLHDL 2.3  LDLDIRECT --   Thyroid Function Tests  [facility] 10/04/11  2121  TSH 1.171  T4TOTAL --  T3FREE --  THYROIDAB --    TELE  Rsr  Radiology/Studies  Nm Myocar Multi W/spect W/wall Motion / Ef  10/05/2011  The patient underwent stress testing with Lexiscan protocol under supervision of cardiology staff. EKG at rest shows LVH with strain pattern which persists unchanged during and after stress. Patient experience dyspnea and diaphoresis with stress. No chest pain.  The quality of the images is fair.  The images are degraded somewhat by motion artefact.  Perfusion images demonstrate a small fixed defect of the midanteroseptal wall.  There is no significant reversibility. Diaphramatic attenuation is present at the base of rest and stress images.  The left ventricle is dilated.  The EDV is 275 ml.  The ESV is 218 ml.  The calculated ejection fraction is markedly reduced at 21%. There is severe global hypokinesis.  Impression: Abnormal Lexiscan Myoview with old small midanteroseptal scar and with severe LV dilatation and global hypokinesis.  EF 21%.   Thomas A. Brackbill MD   Original Report Authenticated By: Heriberto Antigua     ASSESSMENT AND PLAN  1. Syncope:  In setting of new finding of cardiomyopathy.  No events on tele.  Cont bb.  Plan lifevest pending cath.  2.  Cardiomyopathy:  ICM vs NICM (hx of heavy etoh).  No signif reversibility on myoview.  Cath tomorrow.  Cont bb/acei.  HR remains elevated however bp's have been soft - 90's this AM, thus will not push bb further @ this time.  Consider Digoxin.  Signed, Nicolasa Ducking NP As above, patient seen and examined; continue present cardiac meds for LV dysfunction; DC ETOH; proceed with  R and L cath in AM; risks and benefits discussed and patient agrees to proceed; life vest at DC and repeat echo 3 months; if EF <35, ICD. Olga Millers

## 2011-10-08 NOTE — CV Procedure (Signed)
    Cardiac Catheterization Operative Report  Edwin Tyler [id number] 8/26/20138:26 AM DEFAULT,PROVIDER, MD  Procedure Performed:  1. Left Heart Catheterization 2. Selective Coronary Angiography 3. Right Heart Catheterization 4. Left ventricular angiogram  Operator: Verne Carrow, MD  Indication:  Cardiomyopathy with LVEF of 25%, syncope. Cardiac cath to assess filling pressures and exclude CAD as cause of cardiomyopathy.                               Procedure Details: The risks, benefits, complications, treatment options, and expected outcomes were discussed with the patient. The patient and/or family concurred with the proposed plan, giving informed consent. The patient was brought to the cath lab after IV hydration was begun and oral premedication was given. The patient was further sedated with Versed and Fentanyl. The right groin was prepped and draped in the usual manner. Using the modified Seldinger access technique, a 5 French sheath was placed in the femoral artery. A 6 French sheath was inserted into the right femoral vein. A balloon tipped catheter was used to perform a right heart catheterization. Standard diagnostic catheters were used to perform selective coronary angiography. A pigtail catheter was used to perform a left ventricular angiogram. There were no immediate complications. The patient was taken to the recovery area in stable condition.   Hemodynamic Findings: Ao: 91/66                LV: 88/9/15 RA: 2               RV: 22/3/8 PA:  22/9 (mean 16)    PCWP:  12 Fick Cardiac Output: 4.17 L/min Fick Cardiac Index: 1.9 L/min/m2 Central Aortic Saturation: 91% Pulmonary Artery Saturation: 61%   Angiographic Findings:  Left main: No angiographic evidence of disease.   Left Anterior Descending Artery: No angiographic evidence of disease.   Circumflex Artery: No angiographic evidence of disease.   Right Coronary Artery: No angiographic evidence of  disease.   Left Ventricular Angiogram: Global hypokinesis. LVEF 20-25%.   Impression: 1. No angiographic evidence of CAD 2. Severe global LV systolic dysfunction 3. Normal filling pressures  Recommendations: No further ischemic workup. He will need three months of optimal medical therapy and then reassessment of his LVEF by echo in three months. I have discussed a Lifevest (external defibrillator) today with the patient. He is not sure he will wear this. If he agrees, we can help arrange this. He would be ready for d/c from our standpoint after the LifeVest is placed. He will need to f/u with Dr. Myrtis Ser in 2-3 weeks in our [facility] office [facility] cardiology).        Complications:  None; patient tolerated the procedure well.

## 2011-10-08 NOTE — Discharge Instructions (Signed)
You were admitted for chest pain and SOB (following an episode of syncope).  Work up revealed that your heart was not functioning/pumping well (Ejection fraction 25-30%).  You underwent catherization which should normal coronary arteries but a poorly functioning heart.  You will be discharge home with a LifeVest (external defibrillator).  Other work up for your other complaints (diarrhea, weight loss, etc) was negative. We are still awaiting the results of your Hepatitis C viral load.  Please take all of your medications as prescribed.  Please follow up with [place] and with cardiology.  Seek medical attention if you develop worsening chest pain, SOB, fever > 101, or any other alarming symptoms.

## 2011-10-08 NOTE — Interval H&P Note (Signed)
History and Physical Interval Note:  10/08/2011 7:41 AM  Edwin Tyler  has presented today for cardiac cath  with the diagnosis of cardiomyopathy.  The various methods of treatment have been discussed with the patient and family. After consideration of risks, benefits and other options for treatment, the patient has consented to  Procedure(s) (LRB): LEFT AND RIGHT HEART CATHETERIZATION WITH CORONARY ANGIOGRAM (N/A) as a surgical intervention .  The patient's history has been reviewed, patient examined, no change in status, stable for surgery.  I have reviewed the patient's chart and labs.  Questions were answered to the patient's satisfaction.     MCALHANY,CHRISTOPHER

## 2011-10-08 NOTE — Progress Notes (Signed)
Family Medicine Teaching Service Daily Progress Note Service Page: [phone]  Subjective:  Feeling well post catherization. No chest pain or SOB.  States that he is eager to go home.  Objective: Temp:  [97.5 F (36.4 C)-97.7 F (36.5 C)] 97.5 F (36.4 C) (08/26 0446) Pulse Rate:  [83-91] 91  (08/26 0732) Resp:  [18-19] 19  (08/26 0446) BP: (105-109)/(73) 109/73 mmHg (08/26 0446) SpO2:  [96 %-100 %] 100 % (08/26 0446) Weight:  [218 lb 4.8 oz (99.02 kg)] 218 lb 4.8 oz (99.02 kg) (08/26 0446) Admission weight - 223  Exam: General: awake, alert, NAD. Cardiovascular:  No murmurs, rubs, or gallops. Respiratory: Bibasilar rales noted. Abdomen: soft, nontender, nondistended. +BS. Extremities: no edema noted.   I have reviewed the patient's medications, labs, imaging, and diagnostic testing.  Notable results are summarized below.  CBC BMET   Lab 10/07/11 2353 10/06/11 0907 10/05/11 0556  WBC 6.9 5.6 10.7*  HGB 17.1* 17.0 17.3*  HCT 48.2 48.1 49.0  PLT 181 146* 157    Lab 10/07/11 2353 10/07/11 0604 10/06/11 0907  NA 136 136 136  K 5.0 4.6 3.9  CL 102 100 102  CO2 28 29 26   BUN 18 16 13   CREATININE 0.91 0.84 0.78  GLUCOSE 116* 118* 122*  CALCIUM 9.6 9.3 9.2     Cardiac Panel (last 3 results) No results found for this basename: CKTOTAL:3,CKMB:3,TROPONINI:3,RELINDX:3 in the last 72 hours Lipid Panel     Component Value Date/Time   CHOL 125 10/05/2011 0555   TRIG 80 10/05/2011 0555   HDL 55 10/05/2011 0555   CHOLHDL 2.3 10/05/2011 0555   VLDL 16 10/05/2011 0555   LDLCALC 54 10/05/2011 0555   Lab Results  Component Value Date   HGBA1C 5.5 10/04/2011    Lab Results  Component Value Date   VITAMINB12 780 10/04/2011   Imaging/Diagnostic Tests:  Ct Angio Chest Pe W/cm &/or Wo Cm  10/04/2011 IMPRESSION:   No pulmonary emboli or acute abnormality.    Nm Myocar Multi W/spect W/wall Motion / Ef  10/05/2011  Impression: Abnormal Lexiscan Myoview with old small  midanteroseptal scar and with severe LV dilatation and global hypokinesis.  EF 21%.    Echo  Study Conclusions: - Left ventricle: The cavity size was moderately dilated. Wall thickness was increased in a pattern of mild LVH. Systolic function was severely reduced. The estimated ejection fraction was in the range of 25% to 30%. Diffuse hypokinesis. Doppler parameters are consistent with high ventricular filling pressure. - Mitral valve: Mild regurgitation. - Left atrium: The atrium was severely dilated.  Heart Cath revealed no evidence of CAD, but Severe global LV dysfunction.  Assessment/Plan: Edwin Tyler is a 51 y.o. year old male with PMH of Hepatitis C and uncontrolled HTN and possible alcoholism, who presents with chest pain, SOB and recent unwitnessed syncopal episode.  # Chest pain and Cardiomyopathy - EKG revealed sinus tach and LVH. Patient does have significant risk factors - HTN and Family history of CAD. Echo and Nuclear stress showed EF of 25-30%, diffuse hypokinesis, LV dilation, severely dilated left atrium, old midanteroseptal scar. - Cardiac enzymes negative x 3  - Cardiology following and we greatly appreciate their help/input.  - Will continue sublingual nitro, ASA; Cardiology switched metoprolol to Coreg and added Lisinopril. - Cath today revealed no evidence of CAD, but Severe global LV dysfunction.  # SOB - Elevated d-dimer, but CTA negative for PE - Will monitor closely with continuous Pulse ox  -  Likely secondary to underlying Cardiomyopathy -Monitor volume status, daily weights.  # Syncope - likely secondary to low EF  - Basic laboratory work up unremarkable, orthostatics negative - Will continue to monitor on telemetry  - Cardiology recommended LifeVest at discharge  # Hepatitis C  - Patient underwent prior treatment in 2012 and has not followed up.  - Patient reports that he had undetectable viral load following treatment  - Awaiting HCV viral  load - Will need outpatient follow up regarding Hep C.   # HTN  - Continue Coreg and Lisinopril.   # Arm numbness and recent Aphasic event - TSH, RPR, B12 - Normal - possible TIA, continue aspirin.  # Diarrhea, Bright red blood per rectum  - Patient's Hemoglobin is stable at this time  - Hemocult negative  - C diff = negative.  Stool lactoferrin - negative.  Preliminary Stool Cx - no suspicious colonies  # Weight loss  - Patient reports recent nausea, poor appetite, and weight loss of approximately 15 lbs  - HIV ab obtained and was negative.  - Will consider GI consult and further workup, likely as outpatient.  # Depression/Anxiety - Continue celexa  FEN/GI: Heart healthy diet  Prophylaxis: Lovenox SubQ  Disposition: D/C today with Lifevest Code Status: Full code   Edwin Other, DO 10/08/2011, 8:34 AM [phone]

## 2011-10-08 NOTE — Progress Notes (Signed)
Pt slightly agitated about pain medication being changed. MD on call notified and changed prn pain medication from once a day to every 6 hours. Stated it was fine to give Vicodin at 2200, will continue to monitor.

## 2011-10-09 LAB — STOOL CULTURE

## 2011-10-09 MED FILL — Perflutren Lipid Microsphere IV Susp 1.1 MG/ML: INTRAVENOUS | Qty: 10 | Status: AC

## 2011-10-09 NOTE — Progress Notes (Signed)
FMTS Attending Daily Note: Enriqueta Augusta MD [phone] pager office [phone] I have discussed this patient with the resident and reviewed the assessment and plan as documented above. I agree wit the resident's findings and plan.  

## 2011-10-09 NOTE — Discharge Summary (Signed)
Family Medicine Teaching [facility] Discharge Summary  Patient name: Edwin Tyler Medical record number: [id number] Date of birth: 07-26-1960 Age: 51 y.o. Gender: male Date of Admission: 10/04/2011  Date of Discharge: 10/08/11 Admitting Physician: Barbaraann Barthel, MD  Primary Care Provider: Sheila Oats, MD  Indication for Hospitalization: Syncope, Chest pain, SOB Discharge Diagnoses:  Acute Systolic CHF, Nonischemic Cardiomyopathy Chest pain SOB Syncope Hepatitis C HTN Diarrhea and bright red blood per rectum Weight loss Depression/Anxiety Numbness of both right & left arm  Brief Hospital Course:  Edwin Tyler is a 51 y.o. year old male with PMH of Hepatitis C and uncontrolled HTN who presents with chest pain, SOB and recent unwitnessed syncopal episode.  Patient also had several other complaints (see below).  1) Acute Systolic CHF, Nonischemic Cardiomyopathy Given patients symptoms, PMH, and family history (significant for premature CAD) there was concern for ACS.  Patient was started on ASA, beta blocker, and sublingual nitro.  Initial work up was obtained: EKG, Cardiac enzymes and basic labs were obtained.  EKG revealed sinus tachycardia with LVH.  Cardiac enzymes were cycled and were negative.  Cardiology was then consulted by the ED physician.  Cardiology ordered Echo and nuclear stress test.   Echo revealed dilated LV and LA, diffuse hypokinesis, and EF of 25-30%.  Nuclear stress test was negative for ischemia but did reveal old small midanteroseptal scar.    Given Echo findings, patient underwent cardiac catherization, which revealed no evidence of CAD, but severe global LV dysfunction.  Patient's CHF was then determined to be secondary to NICM.  Patient was treated with ASA, Coreg, and Lisinopril.  Patient did well during hospitalization and was discharge on the above regimen.  Patient was also given LifeVest (external defibrillator) given significantly depressed EF and  syncopal episode.  2) Chest pain  Chest pain was atypical in nature.  ACS was ruled out via negative cardiac enzymes and nuclear stress.  Chest pain was likely secondary to tachycardia and underlying CHF/NICM.   3) SOB CTA was obtained given tachycardia and SOB.  CTA was negative for PE. Later work up revealed Systolic CHF secondary to NICM.  This was the likely cause of patient's SOB.  4) Syncope Patient reported that he was walking in the house and began to feel dizzy/lightheaded.  Patient then reports that he passed out.  He later awakened and was SOB and had chest pain.  Patient was monitored on telemetry during admission.  Basic laboratory studies and orthostatic vitals were negative. Patient had no recorded events on tele.  Patient had no additional syncopal episodes during hospitalization.  Syncope was likely secondary to decreased perfusion given low EF.  5) Hepatitis C Patient has a history of Hepatitis C that was treated.  HCV viral load was undetectable.  6) HTN Patient has a known history of HTN and has not been compliant with medication. Patient was treated with Lisinopril and Coreg.  7) Diarrhea Patient reported recent diarrhea and occasional bright red blood per rectum.  Work up (C diff, Stool culture, Fecal Lactoferrin, FOBT) was negative.  Diarrhea and BRBPR resolved during admission.  Prior to discharge, patient was having normal, soft bowel movements.  8) Weight loss Patient reported subjective weight loss of approximatley 15 lbs over the last month.  HIV ab was obtained given PMH and risk factors.  It was negative.  Patient had good PO intake during hospitalization.  Patient will need further outpatient work up if he continues to report weight loss.  9) Depression/Anxiety Patient expressed symptoms of depression and anxiety and reported several life stressors.  Patient cried during several encounters during hospitalization.  Patient was started on Celexa during  admission.  10) Numbness of both right & left arm Patient reported numbness of both right and left arms on admission.  Work up for underlying pathology was negative (TSH, RPR, B12).  Physical exam revealed distribution that was not consistent with focal lesion.  Significant Labs and Imaging:   CBC BMET   Lab 10/07/11 2353 10/06/11 0907 10/05/11 0556  WBC 6.9 5.6 10.7*  HGB 17.1* 17.0 17.3*  HCT 48.2 48.1 49.0  PLT 181 146* 157    Lab 10/07/11 2353 10/07/11 0604 10/06/11 0907  NA 136 136 136  K 5.0 4.6 3.9  CL 102 100 102  CO2 28 29 26   BUN 18 16 13   CREATININE 0.91 0.84 0.78  GLUCOSE 116* 118* 122*  CALCIUM 9.6 9.3 9.2     Lipid Panel    Component  Value  Date/Time    CHOL  125  10/05/2011 0555    TRIG  80  10/05/2011 0555    HDL  55  10/05/2011 0555    CHOLHDL  2.3  10/05/2011 0555    VLDL  16  10/05/2011 0555    LDLCALC  54  10/05/2011 0555    Lab Results   Component  Value  Date    HGBA1C  5.5  10/04/2011    Lab Results   Component  Value  Date    VITAMINB12  780  10/04/2011    Lab Results  Component Value Date   TSH 1.171 10/04/2011   HIV, RPR - Nonreactive.  HCV Viral load - not detected  Hemocult - Negative.  Stool culture, Fecal Lactoferrin, and C diff PCR - negative.  Ct Angio Chest Pe W/cm &/or Wo Cm  10/04/2011 IMPRESSION: No pulmonary emboli or acute abnormality.   Nm Myocar Multi W/spect W/wall Motion / Ef  10/05/2011 Impression: Abnormal Lexiscan Myoview with old small midanteroseptal scar and with severe LV dilatation and global hypokinesis. EF 21%.   Echo Study Conclusions: - Left ventricle: The cavity size was moderately dilated. Wall thickness was increased in a pattern of mild LVH. Systolic function was severely reduced. The estimated ejection fraction was in the range of 25% to 30%. Diffuse hypokinesis. Doppler parameters are consistent with high ventricular filling pressure. - Mitral valve: Mild regurgitation. - Left atrium: The atrium was  severely dilated.   Heart Cath: no evidence of CAD, but Severe global LV dysfunction.  Procedures: Heart Catherization  Consultations: [facility]  Discharge Medications:  Medication List  As of 10/09/2011  6:45 PM   STOP taking these medications         ranitidine 300 MG tablet      triamterene-hydrochlorothiazide 37.5-25 MG per tablet         TAKE these medications         aspirin 81 MG chewable tablet   Chew 1 tablet (81 mg total) by mouth daily.      carvedilol 6.25 MG tablet   Commonly known as: COREG   Take 1 tablet (6.25 mg total) by mouth 2 (two) times daily.      citalopram 20 MG tablet   Commonly known as: CELEXA   Take 1 tablet (20 mg total) by mouth daily.      lisinopril 10 MG tablet   Commonly known as: PRINIVIL,ZESTRIL   Take 1 tablet (10  mg total) by mouth daily.      loratadine 10 MG tablet   Commonly known as: CLARITIN   Take 10 mg by mouth daily.      pantoprazole 40 MG tablet   Commonly known as: PROTONIX   Take 1 tablet (40 mg total) by mouth daily.           Issues for Follow Up:  1) Chest pain and SOB should be readdressed. 2) Inquire about additional episodes of syncope and/or LifeVest defibrillator shocks 3) Compliance with medications should be assessed 4) Recheck BP 5) Diarrhea - work up negative, readdress to see if patient continues to have diarrhea 6) Elevated Hb.  Patient had elevated Hb throughout admission.  May need further work up and/or referral to Hematology. 7) Depression/Anxiety needs to be reassessed.  Patient started on Celexa during admission.  Patient wanted Benzodiazepine for anxiety but was not given during admission as we did not feel it was necessary. 8) Patient has history of Hep C. Viral load was undetectable during admission.  Will likely need repeat at least yearly. 9) consider further work up if numbness continues Outstanding Results: None  Discharge Instructions: Patient was counseled important signs  and symptoms that should prompt return to medical care, changes in medications, dietary instructions, activity restrictions, and follow up appointments.    Follow-up Information    Follow up with DE LA CRUZ,IVY, MD on 10/16/2011. [phone])    Contact information:   [address] [place] [address] [phone]       Follow up with Arvilla Meres, MD on 10/22/2011. (11:30)    Contact information:   [address] [place] [address] [phone]          Discharge Condition: Stable. Discharged home.  [name], DO 10/09/2011, 6:45 PM

## 2011-10-09 NOTE — Progress Notes (Signed)
Patient Name: Edwin Tyler Date of Encounter: 10/09/2011     Principal Problem:  *Syncope Active Problems:  Chest pain  Hypertension  Sinus tachycardia  Cardiomyopathy (newly Dx - ? ICM vs. NICM)   SUBJECTIVE   Denies CP or SOB, wants to go home  CURRENT MEDS    . aspirin  81 mg Oral Daily  . carvedilol  6.25 mg Oral BID WC  . citalopram  20 mg Oral Daily  . enoxaparin (LOVENOX) injection  40 mg Subcutaneous Q24H  . feeding supplement  1 Container Oral TID BM  . lisinopril  10 mg Oral Daily  . pantoprazole  40 mg Oral Q1200  . banana bag IV fluid 1000 mL   Intravenous Once  . sodium chloride  3 mL Intravenous Q12H    OBJECTIVE  Filed Vitals:   10/08/11 1232 10/08/11 2052 10/09/11 0633 10/09/11 1026  BP: 107/82 107/75 118/77 104/68  Pulse: 89 87 93   Temp:  97.3 F (36.3 C) 97.9 F (36.6 C)   TempSrc:  Axillary Oral   Resp:  18 18   Height:      Weight:      SpO2: 100% 97% 99%     Intake/Output Summary (Last 24 hours) at 10/09/11 1125 Last data filed at 10/09/11 1610  Gross per 24 hour  Intake    480 ml  Output    900 ml  Net   -420 ml   Filed Weights   10/06/11 0300 10/07/11 0407 10/08/11 0446  Weight: 218 lb 14.7 oz (99.3 kg) 218 lb 4.8 oz (99.02 kg) 218 lb 4.8 oz (99.02 kg)   PHYSICAL EXAM  General: Pleasant, NAD. Neuro: Alert and oriented X 3. Moves all extremities spontaneously. Psych: Normal affect. HEENT:  Normal  Neck: Supple without bruits or JVD. Lungs:  Resp regular and unlabored, CTA. Heart: RRR, tachy no s3, s4, or murmurs. Abdomen: Soft, non-tender, non-distended, BS + x 4.  Extremities: No clubbing, cyanosis or edema. DP/PT/Radials 2+ and equal bilaterally.  Accessory Clinical Findings  CBC  Basename 10/07/11 2353  WBC 6.9  NEUTROABS --  HGB 17.1*  HCT 48.2  MCV 94.9  PLT 181   Basic Metabolic Panel  Basename 10/07/11 2353 10/07/11 0604  NA 136 136  K 5.0 4.6  CL 102 100  CO2 28 29  GLUCOSE 116* 118*  BUN 18  16  CREATININE 0.91 0.84  CALCIUM 9.6 9.3  MG -- --  PHOS -- --  TELE- no arrhythmias  ASSESSMENT AND PLAN  1. Acute systolic dysfunction  EF is newly depressed. This is of an unclear etiology, though ETOH may be at play.  Cath revealed no CAD.  Lifestyle modification is advised.   He will establish care in the CHF clinic to evaluate for other causes of his nonischemic CM and continue medical titration. OK to discharge on current medicine regimen with close follow-up in the CHF clinic. 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  2. Syncope- the cause for his syncope is presently not clear. His has a somewhat bizarre affect and history. I am not certain that his syncope was related to arrhythmia, though with newly depressed EF, I think that we have to consider this as a strong possibility. Given his comorbidities as well as emotional instability and recent heavy ETOH, I do not think that he is presently a candidate for an ICD. I would favor aggressive  medical management and repeat EF in 3-6 months before making this decision. I do think that he should have a LifeVest placed at discharge and this is scheduled to be placed today.  I have instructed the patient that he cannot drive for 6 months following his syncopal event.  He is not happy with this instruction but will comply.  He will follow closely in the CHF clinic.  I will be happy to see him again after 3 months of medical optimization.  He is instructed to apply for [facility] financial assistance.

## 2011-10-09 NOTE — Progress Notes (Signed)
Pt discharged per MD order and protocol. All discharge instructions reviewed with patient and all questions answered. All rx'es given and 3 day supply of all medications given from pharmacy. Pt aware of all follow up appointments. Pt has life vest on.

## 2011-10-09 NOTE — Progress Notes (Signed)
Family Medicine Teaching Service Daily Progress Note Service Page: [phone]  Subjective:  Feeling well this am.  No chest pain or SOB.  Patient inquired about returning to work and is worried he will not be able to pay his bills.     Objective: Temp:  [97.3 F (36.3 C)-97.9 F (36.6 C)] 97.9 F (36.6 C) (08/27 2956) Pulse Rate:  [87-110] 93  (08/27 0633) Resp:  [18] 18  (08/27 0633) BP: (107-133)/(75-96) 118/77 mmHg (08/27 0633) SpO2:  [97 %-100 %] 99 % (08/27 0633) Admission weight - 223  Exam: General: awake, alert, NAD. Cardiovascular:  No murmurs, rubs, or gallops. Respiratory: CTAB. No rales, rhonchi, or wheeze. Abdomen: soft, nontender, nondistended. +BS. Extremities: no edema noted.   I have reviewed the patient's medications, labs, imaging, and diagnostic testing.  Notable results are summarized below.  CBC BMET   Lab 10/07/11 2353 10/06/11 0907 10/05/11 0556  WBC 6.9 5.6 10.7*  HGB 17.1* 17.0 17.3*  HCT 48.2 48.1 49.0  PLT 181 146* 157    Lab 10/07/11 2353 10/07/11 0604 10/06/11 0907  NA 136 136 136  K 5.0 4.6 3.9  CL 102 100 102  CO2 28 29 26   BUN 18 16 13   CREATININE 0.91 0.84 0.78  GLUCOSE 116* 118* 122*  CALCIUM 9.6 9.3 9.2     Cardiac Panel (last 3 results) No results found for this basename: CKTOTAL:3,CKMB:3,TROPONINI:3,RELINDX:3 in the last 72 hours Lipid Panel     Component Value Date/Time   CHOL 125 10/05/2011 0555   TRIG 80 10/05/2011 0555   HDL 55 10/05/2011 0555   CHOLHDL 2.3 10/05/2011 0555   VLDL 16 10/05/2011 0555   LDLCALC 54 10/05/2011 0555   Lab Results  Component Value Date   HGBA1C 5.5 10/04/2011    Lab Results  Component Value Date   VITAMINB12 780 10/04/2011   Imaging/Diagnostic Tests:  Ct Angio Chest Pe W/cm &/or Wo Cm  10/04/2011 IMPRESSION:   No pulmonary emboli or acute abnormality.    Nm Myocar Multi W/spect W/wall Motion / Ef  10/05/2011  Impression: Abnormal Lexiscan Myoview with old small midanteroseptal scar and  with severe LV dilatation and global hypokinesis.  EF 21%.    Echo  Study Conclusions: - Left ventricle: The cavity size was moderately dilated. Wall thickness was increased in a pattern of mild LVH. Systolic function was severely reduced. The estimated ejection fraction was in the range of 25% to 30%. Diffuse hypokinesis. Doppler parameters are consistent with high ventricular filling pressure. - Mitral valve: Mild regurgitation. - Left atrium: The atrium was severely dilated.  Heart Cath revealed no evidence of CAD, but Severe global LV dysfunction.  Assessment/Plan: Edwin Tyler is a 51 y.o. year old male with PMH of Hepatitis C and uncontrolled HTN who presents with chest pain, SOB and recent unwitnessed syncopal episode.  # Chest pain and Cardiomyopathy - EKG revealed sinus tach and LVH. Patient does have significant risk factors - HTN and Family history of CAD.  - Echo and Nuclear stress showed EF of 25-30%, diffuse hypokinesis, LV dilation, severely dilated left atrium, old midanteroseptal scar. - Cardiac enzymes negative x 3  - Cardiology following and we greatly appreciate their help/input.  - Will continue sublingual nitro, ASA; Cardiology switched metoprolol to Coreg and added Lisinopril. - Cath on 8/26 revealed no evidence of CAD, but Severe global LV dysfunction. - Patient to be discharged today following LifeVest fitting/instruction. Will discuss work situation with Cards today.  # SOB -  Elevated d-dimer, but CTA negative for PE - Likely secondary to underlying Cardiomyopathy  # Syncope - likely secondary to low EF  - Basic laboratory work up unremarkable, orthostatics negative - Will continue to monitor on telemetry  - Will be discharged with LifeVest  # Hepatitis C  - Patient underwent prior treatment in 2012 and has not followed up.  - Patient reports that he had undetectable viral load following treatment  - HCV viral load - No virus detected - Will need  outpatient follow up regarding Hep C.   # HTN  - Continue Coreg 6.25 mg BID and Lisinopril 10 mg. - Patient normotensive today (118/77)  # Arm numbness and recent Aphasic event - TSH, RPR, B12 - Normal - possible TIA, continue aspirin. - No further reports of numbness during admission  # Diarrhea, Bright red blood per rectum - Resolved.  Patient now have daily normal BM. No blood. - Work up: C diff, Lactoferrin, FOBT, Stool culture.  All were negative. - Will readdress at follow up  # Weight loss  - Patient reports recent nausea, poor appetite, and weight loss of approximately 15 lbs  - HIV ab obtained and was negative.  - Has been eating well during admission - Will readdress at follow up.  # Depression/Anxiety - Continue celexa  FEN/GI: Heart healthy diet  Prophylaxis: Lovenox SubQ  Disposition: D/C today with Lifevest Code Status: Full code   [name], DO 10/09/2011, 7:37 AM [phone]

## 2011-10-10 NOTE — Discharge Summary (Signed)
Family Medicine Teaching Service  Discharge Note : Attending Sara Neal MD Pager [phone] Office [phone] I have seen and examined this patient, reviewed their chart and discussed discharge planning wit the resident at the time of discharge. I agree with the discharge plan as above.  

## 2011-10-11 ENCOUNTER — Telehealth: Payer: Self-pay

## 2011-10-11 NOTE — Telephone Encounter (Signed)
Patient is calling because the Protonix is too expensive and he would like something more affordable.

## 2011-10-16 ENCOUNTER — Inpatient Hospital Stay: Payer: Self-pay | Admitting: Family Medicine

## 2011-10-16 ENCOUNTER — Other Ambulatory Visit: Payer: Self-pay | Admitting: Family Medicine

## 2011-10-16 MED ORDER — OMEPRAZOLE 20 MG PO CPDR: 20.0000 mg | DELAYED_RELEASE_CAPSULE | Freq: Every day | ORAL | Status: DC

## 2011-10-16 NOTE — Telephone Encounter (Signed)
Sent prescription for Omeprazole to patient's pharmacy.

## 2011-10-19 ENCOUNTER — Telehealth: Payer: Self-pay

## 2011-10-19 NOTE — Telephone Encounter (Signed)
Returned call to [place] - Building surveyor # is [phone] Angelique Blonder did not give him the message from Dr Adriana Simas)

## 2011-10-19 NOTE — Telephone Encounter (Signed)
Called and LMOVM informing him that I faxed a letter to him that Dr. Adriana Simas had written for the pt prior to his D/C from the hospital. Should he need any additional information he can call our office.Loralee Pacas [place]

## 2011-10-19 NOTE — Telephone Encounter (Signed)
Spoke with Dr. Adriana Simas in regards to this pt's request and he stated that he will not write a letter stating that this pt is bed ridden. Laureen Ochs, Viann Shove

## 2011-10-19 NOTE — Telephone Encounter (Signed)
Called pt to inform him that he was to have followed up with Dr.de la Cruz on 9.3.2013. He stated that he didn't come to the appt because his brother had to work and could not bring him. Pt stated that Dr. Adriana Simas told him that due to his condition he needed to rest as this would help in his healing process. He then proceeded to tell me that his PO has been coming by and that he has been sleep and did not hear him knocking on his door and that because he was told to rest he has been doing that and that if he doesn't get this letter he could get locked up.  Pt then read to me the letter that Dr. Adriana Simas gave him before being D/C'ed. I told the pt that being that he was to follow up with our office when he was d/c'ed that it would have been beneficial to him in regards to what is asking for. He ask if Dr. Adriana Simas could give him a call I told him that I would forward this message to Dr. Adriana Simas and I suggested that he give this letter to his PO due to the fact that we have not seen him in our clinic, and that I will fax this letter to his PO.

## 2011-10-19 NOTE — Telephone Encounter (Signed)
Pt is asking for a letter stating that he is bed ridden- would like to speak with Dr Adriana Simas about this - needs this for his probation officer - needs today if at all possible Needs this Edwin Tyler - probation officer Fax# [phone]

## 2011-10-22 ENCOUNTER — Ambulatory Visit (HOSPITAL_COMMUNITY): Admit: 2011-10-22 | Payer: Self-pay

## 2011-10-22 ENCOUNTER — Inpatient Hospital Stay: Payer: Self-pay | Admitting: Family Medicine

## 2011-10-24 ENCOUNTER — Encounter (HOSPITAL_COMMUNITY): Payer: Self-pay | Admitting: Emergency Medicine

## 2011-10-24 ENCOUNTER — Emergency Department (HOSPITAL_COMMUNITY)

## 2011-10-24 ENCOUNTER — Observation Stay (HOSPITAL_COMMUNITY)
Admission: EM | Admit: 2011-10-24 | Discharge: 2011-10-24 | Disposition: A | Discharge status: Home / Self Care | Attending: Family Medicine | Admitting: Family Medicine

## 2011-10-24 DIAGNOSIS — I10 Essential (primary) hypertension: Secondary | ICD-10-CM | POA: Insufficient documentation

## 2011-10-24 DIAGNOSIS — K219 Gastro-esophageal reflux disease without esophagitis: Secondary | ICD-10-CM | POA: Insufficient documentation

## 2011-10-24 DIAGNOSIS — I5022 Chronic systolic (congestive) heart failure: Secondary | ICD-10-CM | POA: Insufficient documentation

## 2011-10-24 DIAGNOSIS — I428 Other cardiomyopathies: Secondary | ICD-10-CM | POA: Insufficient documentation

## 2011-10-24 DIAGNOSIS — R079 Chest pain, unspecified: Principal | ICD-10-CM | POA: Insufficient documentation

## 2011-10-24 DIAGNOSIS — R0789 Other chest pain: Secondary | ICD-10-CM

## 2011-10-24 DIAGNOSIS — Z79899 Other long term (current) drug therapy: Secondary | ICD-10-CM | POA: Insufficient documentation

## 2011-10-24 DIAGNOSIS — B1920 Unspecified viral hepatitis C without hepatic coma: Secondary | ICD-10-CM | POA: Insufficient documentation

## 2011-10-24 DIAGNOSIS — I5023 Acute on chronic systolic (congestive) heart failure: Secondary | ICD-10-CM

## 2011-10-24 DIAGNOSIS — F3289 Other specified depressive episodes: Secondary | ICD-10-CM | POA: Insufficient documentation

## 2011-10-24 DIAGNOSIS — F329 Major depressive disorder, single episode, unspecified: Secondary | ICD-10-CM

## 2011-10-24 DIAGNOSIS — I509 Heart failure, unspecified: Secondary | ICD-10-CM | POA: Insufficient documentation

## 2011-10-24 DIAGNOSIS — I429 Cardiomyopathy, unspecified: Secondary | ICD-10-CM | POA: Diagnosis present

## 2011-10-24 DIAGNOSIS — I5021 Acute systolic (congestive) heart failure: Secondary | ICD-10-CM | POA: Diagnosis present

## 2011-10-24 HISTORY — DX: Other cardiomyopathies: I42.8

## 2011-10-24 LAB — RAPID URINE DRUG SCREEN, HOSP PERFORMED
Cocaine: NOT DETECTED
Opiates: POSITIVE — AB

## 2011-10-24 LAB — CK TOTAL AND CKMB (NOT AT ARMC)
Relative Index: INVALID (ref 0.0–2.5)
Total CK: 41 U/L (ref 7–232)

## 2011-10-24 LAB — POCT I-STAT, CHEM 8
Hemoglobin: 16.7 g/dL (ref 13.0–17.0)
Sodium: 141 mEq/L (ref 135–145)
TCO2: 22 mmol/L (ref 0–100)

## 2011-10-24 LAB — POCT I-STAT TROPONIN I: Troponin i, poc: 0.01 ng/mL (ref 0.00–0.08)

## 2011-10-24 LAB — PRO B NATRIURETIC PEPTIDE: Pro B Natriuretic peptide (BNP): 953.8 pg/mL — ABNORMAL HIGH (ref 0–125)

## 2011-10-24 LAB — CBC
MCHC: 35 g/dL (ref 30.0–36.0)
Platelets: 192 10*3/uL (ref 150–400)
RDW: 12.9 % (ref 11.5–15.5)

## 2011-10-24 MED ORDER — ONDANSETRON HCL 4 MG/2ML IJ SOLN: 4.0000 mg | Freq: Four times a day (QID) | INTRAMUSCULAR | Status: DC | PRN

## 2011-10-24 MED ORDER — ASPIRIN 81 MG PO CHEW
81.0000 mg | CHEWABLE_TABLET | Freq: Every day | ORAL | Status: DC
  Administered 2011-10-24: 81 mg via ORAL
  Filled 2011-10-24: qty 1

## 2011-10-24 MED ORDER — NITROGLYCERIN 0.4 MG SL SUBL: 0.4000 mg | SUBLINGUAL_TABLET | SUBLINGUAL | Status: DC | PRN

## 2011-10-24 MED ORDER — CITALOPRAM HYDROBROMIDE 20 MG PO TABS
20.0000 mg | ORAL_TABLET | Freq: Every day | ORAL | Status: DC
  Administered 2011-10-24: 20 mg via ORAL
  Filled 2011-10-24: qty 1

## 2011-10-24 MED ORDER — MORPHINE SULFATE 2 MG/ML IJ SOLN
2.0000 mg | INTRAMUSCULAR | Status: DC | PRN
  Administered 2011-10-24: 2 mg via INTRAVENOUS
  Filled 2011-10-24: qty 1

## 2011-10-24 MED ORDER — LISINOPRIL 10 MG PO TABS
10.0000 mg | ORAL_TABLET | Freq: Every day | ORAL | Status: DC
  Administered 2011-10-24: 10 mg via ORAL
  Filled 2011-10-24: qty 1

## 2011-10-24 MED ORDER — HEPARIN SODIUM (PORCINE) 5000 UNIT/ML IJ SOLN
5000.0000 [IU] | Freq: Three times a day (TID) | INTRAMUSCULAR | Status: DC
  Administered 2011-10-24 (×2): 5000 [IU] via SUBCUTANEOUS
  Filled 2011-10-24 (×4): qty 1

## 2011-10-24 MED ORDER — LORATADINE 10 MG PO TABS
10.0000 mg | ORAL_TABLET | Freq: Every day | ORAL | Status: DC
  Administered 2011-10-24: 10 mg via ORAL
  Filled 2011-10-24: qty 1

## 2011-10-24 MED ORDER — ONDANSETRON HCL 8 MG PO TABS
4.0000 mg | ORAL_TABLET | Freq: Four times a day (QID) | ORAL | Status: DC | PRN
  Filled 2011-10-24: qty 0.5

## 2011-10-24 MED ORDER — SODIUM CHLORIDE 0.9 % IJ SOLN
3.0000 mL | Freq: Two times a day (BID) | INTRAMUSCULAR | Status: DC
  Administered 2011-10-24: 3 mL via INTRAVENOUS

## 2011-10-24 MED ORDER — CARVEDILOL 6.25 MG PO TABS
6.2500 mg | ORAL_TABLET | Freq: Two times a day (BID) | ORAL | Status: DC
  Administered 2011-10-24: 6.25 mg via ORAL
  Filled 2011-10-24 (×2): qty 1

## 2011-10-24 MED ORDER — PANTOPRAZOLE SODIUM 40 MG PO TBEC
40.0000 mg | DELAYED_RELEASE_TABLET | Freq: Every day | ORAL | Status: DC
  Administered 2011-10-24: 40 mg via ORAL
  Filled 2011-10-24: qty 1

## 2011-10-24 NOTE — H&P (Signed)
Family Medicine Teaching [facility] Admission History and Physical Service Pager: [phone]  Patient name: Edwin Tyler Medical record number: [id number] Date of birth: [birth year] Age: 51 y.o. Gender: male  Primary Care Provider: [name], DO  Chief Complaint: Chest pain  Assessment and Plan: Edwin Tyler is a 51 y.o. year old male presenting with Chest pain 1. Chest pain- It's possible that with his heart failure this angina was precipitated by his recent stressors. Other etiologies include but are not limited to acute plaque rupture, coronary vasospasm,  or non-cardiac origins.  1. EKG in the Am, Cycle troponins -  negative times 1  2. Urine drug screen 3. Cath performed in 09/2011 shows no CAD but severe global LV dysfunction.  4. Consult [facility] in the AM 2. Chronic systolic heart failure 1. EF on 8/23 was 25-30 % 2. Continue Aspirin, BB, and Ace inhibitor 3. HTN - controlled today, last BP 126/88 1. Continue home BB and Ace inhibitor 2. Vital signs per floor protocol 4. Depression 1. Continue Home dose celexa 5. GERD- asymptomatic currently 1. Continue PPI  6. Hepatitis C 1. No detectable viral load at last admission, Will monitor if symptoms develop.  7. FEN/GI: Regular diet 8. Prophylaxis: Sub q Heparin 5000 units TID 9. Disposition: Admit to telemetry for observation, discharge to custody of sheriff's department pending  10. Code Status: Full  History of Present Illness: Edwin Tyler is a 51 y.o. year old male presenting with Chest pain.  Patient states that since his last admission he has been mostly bed-ridden and not working. He reports good adherence to his new medications. His chest pain started around 5 pm on 10/23/2011, when he felt hot and dizzy and had to sit down. He reports the pain as mid-sternal, accompanied by BL arm numbness and diaphoresis, and relieved by nitro given to him in the ED. He feels like it may be due to stress as he  was arrested today for a crime that he reports he did not commit. He denies recent heartburn, fevers, chills, and dysuria. He has had some diarrhea "pretty regularly," patient would not specify with more direct questioning.   Patient Active Problem List  Diagnosis  . Syncope  . Chest pain  . Numbness  . Weight loss  . Dizziness  . Hypertension  . Chills  . Acute systolic CHF (congestive heart failure)  . Sinus tachycardia  . Cardiomyopathy   Past Medical History: Past Medical History  Diagnosis Date  . Hypertension   . Hepatitis C   . GERD (gastroesophageal reflux disease)   . Acute systolic CHF (congestive heart failure) 10/05/2011  . Syncope 10/05/2011  . Chest pain 10/05/2011   Past Surgical History: Past Surgical History  Procedure Date  . Orif congenital hip dislocation   . Basal cell carcinoma excision     back  . Hand surgery     left, glass removal   Social History: History  Substance Use Topics  . Smoking status: Former Smoker -- 3 years    Quit date: 02/12/1981  . Smokeless tobacco: Never Used  . Alcohol Use: Yes     6 beers/wk, 1 pint liquor/wk   For any additional social history documentation, please refer to relevant sections of EMR.  Family History: Family History  Problem Relation Age of Onset  . Heart disease Mother     Died of MI at 18  . Heart disease Brother     PCI at 2  .  Heart disease Maternal Uncle     PCI at 46   Allergies: No Known Allergies No current facility-administered medications on file prior to encounter.   Current Outpatient Prescriptions on File Prior to Encounter  Medication Sig Dispense Refill  . aspirin 81 MG chewable tablet Chew 1 tablet (81 mg total) by mouth daily.  30 tablet  0  . carvedilol (COREG) 6.25 MG tablet Take 1 tablet (6.25 mg total) by mouth 2 (two) times daily.  30 tablet  0  . citalopram (CELEXA) 20 MG tablet Take 1 tablet (20 mg total) by mouth daily.  30 tablet  0  . lisinopril (PRINIVIL,ZESTRIL) 10  MG tablet Take 1 tablet (10 mg total) by mouth daily.  30 tablet  0  . loratadine (CLARITIN) 10 MG tablet Take 10 mg by mouth daily.      Marland Kitchen omeprazole (PRILOSEC) 20 MG capsule Take 1 capsule (20 mg total) by mouth daily.  30 capsule  3   Review Of Systems: Per HPI. Otherwise 12 point review of systems was performed and was unremarkable.  Physical Exam: BP 111/78  Pulse 75  Temp 98.7 F (37.1 C) (Oral)  Resp 17  SpO2 99% Exam: General: NAD, lying in bed in restraints on hands and feet.  HEENT: EOMI, PERRL, Conjunctiva injected BL, pupils 6-7 mm BL  Cardiovascular: RRR, normal S1/S2, No murmur,  Dp pulse 2+  Respiratory: CTAB, Non-labored Abdomen: Soft, NT/ND Extremities: Warm, Trace edema on BL LE Neuro: A&O X 4, no gross deficits  Labs and Imaging: CBC BMET   Lab 10/24/11 0031  WBC --  HGB 16.7  HCT 49.0  PLT --    Lab 10/24/11 0031  NA 141  K 3.7  CL 106  CO2 --  BUN 12  CREATININE 0.80  GLUCOSE 89  CALCIUM --     CXR 10/24/2011 IMPRESSION: No evidence of active pulmonary disease.  Kevin Fenton, MD 10/24/2011, 2:20 AM   I have seen and examined the patient and agree with the above.  I have helped with the formulation of this patient's plan of care and agree with the plan as laid out above. Murat Rideout 10/24/2011,3:25 AM

## 2011-10-24 NOTE — ED Notes (Signed)
Labs and CXR preformed during downtime.

## 2011-10-24 NOTE — Progress Notes (Signed)
D/c orderes received;IV removed with gauze on, pt remains in stable condition; pt meds and instructions reviewed and given to pts officer; pt d/c in custody of [place] police department, life vest put on prior to d/c; also reviewed s/s of CHF and to weigh self daily

## 2011-10-24 NOTE — H&P (Signed)
I have seen and examined this patient. I have discussed with Dr Louanne Belton.  I agree with their findings and plans as documented in their admission note.  Acute Issues 1. Chest pain, atypical  - Continuous chest discomfort localized to proximal parasternal area.  - Cardiac Cath last month showed no CAD.  Ventriculogram showed severe global LV dysfunction.  - Initial Troponin <0.30 - Unable to locate 10/24/11 ECG - Suspect nor-cardiac origin of discomfort  Plan  Complete cardiac enzyme series  Consult cardiology   If cardiology considers pain generator is not cardiac in origin, would treat for Musculosketal pain with NSAIDs or APAP, Lidoderm patch, topical NSAID, etc..

## 2011-10-24 NOTE — Progress Notes (Signed)
UR Completed Martika Egler Graves-Bigelow, RN,BSN [phone]  

## 2011-10-24 NOTE — Discharge Summary (Signed)
Family Medicine Teaching [facility] Discharge Summary  Patient name: Edwin Tyler Medical record number: [id number] Date of birth: 10-Apr-1960 Age: 51 y.o. Gender: male Date of Admission: 10/24/2011  Date of Discharge: 10/24/2011 Admitting Physician: Leighton Roach McDiarmid, MD  Primary Care Provider: No primary provider on file.  Indication for Hospitalization: Chest pain Rule out ACS Discharge Diagnoses:  1. Chest pain 2. HTN 3. Non-ischemic Cardiomyopathy 4. Systolic Heart failure  Consultations: [facility]  Significant Labs and Imaging:  Troponin <0.30 X 2, POC Troponin 0.01 X1 Pro BNP 953 UDS + for Benzodiazepine, Opiates, THC (only morphine administered prior to test)  CXR 10/24/11 IMPRESSION: No evidence of active pulmonary disease.  ECG:  10/24/2011:  Sinus bradycardia Nonspecific T wave abnormality Prolonged QT Abnormal ECG  Echo: 10/05/2011  Study Conclusions - Left ventricle: Mild LVH, estimated ejection fraction was in the range of 25% to 30%. - Mitral valve: Mild regurgitation. - Left atrium: The atrium was severely dilated.   Exercise Stress Test: 10/05/2011  Study Conclusions -  No signifcant change with stress.   Cardiac Cath: 10/08/2011  Angiographic Findings:  NO CAD  Global hypokinesis of the L ventricle. LVEF 20-25%.    Procedures: None this admission  Brief Hospital Course:  Edwin Tyler is a 51 y.o. year old male presenting with Chest pain  1. Chest pain - He presented to the ED after being arrested and onset of chest pain for a few hours. The pain was relieved with nitro and Morphine. Troponins were cycled and all returned negative. He was previously admitted in late august and had a very thorough work up at the time by [facility]. They were consulted and made the recommendation that no other work-up was needed and that he needed f/u ECHO in three months after optimal medical treatment. They discussed a Lifevest (externeal  defibrillator) which the patient was agreeable with and requested f/u in 2-3 weeks.    2. Chronic systolic heart failure- he was continued on his home ASA, BB, and Ace inhibitor. His Pro BNP was 953, it was 835 during his last visit. He was not started on Lasix during the admission and there was no need for a repeat echo.   3. HTN - Was controlled throughout the admission on his home BB and Ace inhibitor.  4. Depression - His home celexa was continued.   5. GERD- He remained asymptomatic and his PPI was continued.  6. Hepatitis C - He had no detectable viral load at last admission, no repeat labs were deemed necessary.   Discharge Medications:    Medication List     As of 10/24/2011  9:34 PM    TAKE these medications         aspirin 81 MG chewable tablet   Chew 1 tablet (81 mg total) by mouth daily.      carvedilol 6.25 MG tablet   Commonly known as: COREG   Take 1 tablet (6.25 mg total) by mouth 2 (two) times daily.      citalopram 20 MG tablet   Commonly known as: CELEXA   Take 1 tablet (20 mg total) by mouth daily.      FISH OIL PO   Take 1 capsule by mouth daily.      lisinopril 10 MG tablet   Commonly known as: PRINIVIL,ZESTRIL   Take 1 tablet (10 mg total) by mouth daily.      loratadine 10 MG tablet   Commonly known as: CLARITIN  Take 10 mg by mouth daily.      omeprazole 20 MG capsule   Commonly known as: PRILOSEC   Take 1 capsule (20 mg total) by mouth daily.       Issues for Follow Up: Medication adherence, Continued Chest pain, F/u echo in 2-3 months, F/u with [facility] in 2-3 weeks  Outstanding Results: None  Discharge Instructions: Please refer to Patient Instructions section of EMR for full details.  Patient was counseled important signs and symptoms that should prompt return to medical care, changes in medications, dietary instructions, activity restrictions, and follow up appointments.  Significant instructions noted below:  Discharge  Condition: Stable Kevin Fenton, MD 10/24/2011, 9:34 PM
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
## 2011-10-24 NOTE — ED Notes (Signed)
Pt to ED  C/o mid chest pain onset earlier tonight.  Pt was given ASA 324mg  and NTG x's 2.
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
## 2011-10-25 NOTE — Discharge Summary (Signed)
Reviewed, discussed and agree with the documentation and management by Dr. Ermalinda Memos.

## 2011-10-25 NOTE — ED Provider Notes (Signed)
History     CSN: [id number]  Arrival date & time 10/24/11  0007   First MD Initiated Contact with Patient 10/24/11 0018      Chief Complaint  Patient presents with  . Chest Pain     HPI Pt to ED C/o mid chest pain onset earlier tonight. Pt was given ASA 324mg  and NTG x's 2. Recently arrested for attempted murder.  Recent clean cath    Past Medical History  Diagnosis Date  . Hypertension   . Hepatitis C     Treated with Interferon Ribavirin x58yr. Last treatment 06/2010  . GERD (gastroesophageal reflux disease)   . Acute systolic CHF (congestive heart failure) 10/05/2011  . Syncope 10/05/2011  . Chest pain 10/05/2011  . NICM (nonischemic cardiomyopathy) 10/05/2011    Cath with no CAD, normal filling pressures, EF 23-30%    Past Surgical History  Procedure Date  . Orif congenital hip dislocation   . Basal cell carcinoma excision     back  . Hand surgery     left, glass removal  . Cardiac catheterization     Family History  Problem Relation Age of Onset  . Heart disease Mother     Died of MI at 20  . Heart disease Brother     PCI at 31  . Heart disease Maternal Uncle     PCI at 57    History  Substance Use Topics  . Smoking status: Former Smoker -- 3 years    Quit date: 02/12/1981  . Smokeless tobacco: Never Used  . Alcohol Use: Yes     6 beers/wk, 1 pint liquor/wk      Review of Systems  All other systems reviewed and are negative.    Allergies  Review of patient's allergies indicates no known allergies.  Home Medications   Current Outpatient Rx  Name Route Sig Dispense Refill  . ASPIRIN 81 MG PO CHEW Oral Chew 1 tablet (81 mg total) by mouth daily. 30 tablet 0  . CARVEDILOL 6.25 MG PO TABS Oral Take 1 tablet (6.25 mg total) by mouth 2 (two) times daily. 30 tablet 0  . CITALOPRAM HYDROBROMIDE 20 MG PO TABS Oral Take 1 tablet (20 mg total) by mouth daily. 30 tablet 0  . LISINOPRIL 10 MG PO TABS Oral Take 1 tablet (10 mg total) by mouth daily. 30  tablet 0  . LORATADINE 10 MG PO TABS Oral Take 10 mg by mouth daily.    Marland Kitchen FISH OIL PO Oral Take 1 capsule by mouth daily.    Marland Kitchen OMEPRAZOLE 20 MG PO CPDR Oral Take 1 capsule (20 mg total) by mouth daily. 30 capsule 3    BP 111/75  Pulse 73  Temp 97.4 F (36.3 C) (Oral)  Resp 18  Ht 5\' 11"  (1.803 m)  Wt 220 lb (99.791 kg)  BMI 30.68 kg/m2  SpO2 99%  Physical Exam  Nursing note and vitals reviewed. Constitutional: He is oriented to person, place, and time. He appears well-developed. No distress.  HENT:  Head: Normocephalic and atraumatic.  Eyes: Pupils are equal, round, and reactive to light.  Neck: Normal range of motion.  Cardiovascular: Normal rate and intact distal pulses.        Sinus bradycardia Rate=58 Nonspecific T wave abnormality Prolonged QT Abnormal ECG  Pulmonary/Chest: No respiratory distress.  Abdominal: Normal appearance. He exhibits no distension.  Musculoskeletal: Normal range of motion.  Neurological: He is alert and oriented to person, place, and time. No  cranial nerve deficit.  Skin: Skin is warm and dry. No rash noted.  Psychiatric: He has a normal mood and affect. His behavior is normal.    ED Course  Procedures (including critical care time)  Labs Reviewed  PRO B NATRIURETIC PEPTIDE - Abnormal; Notable for the following:    Pro B Natriuretic peptide (BNP) 953.8 (*)     All other components within normal limits  URINE RAPID DRUG SCREEN (HOSP PERFORMED) - Abnormal; Notable for the following:    Opiates POSITIVE (*)     Benzodiazepines POSITIVE (*)     Tetrahydrocannabinol POSITIVE (*)     All other components within normal limits  POCT I-STAT, CHEM 8  POCT I-STAT TROPONIN I  CBC  TROPONIN I  TROPONIN I  CK TOTAL AND CKMB   Dg Chest Portable 1 View  10/24/2011  *RADIOLOGY REPORT*  Clinical Data: Chest pain  PORTABLE CHEST - 1 VIEW  Comparison: 10/04/2011  Findings: Cardiac enlarged with normal pulmonary vascularity.  No focal airspace  consolidation in the lungs.  No blunting of costophrenic angles.  No pneumothorax.  Mediastinal contours appear intact.  No significant change since previous study.  IMPRESSION: No evidence of active pulmonary disease.   Original Report Authenticated By: Marlon Pel, M.D.      1. Cardiomyopathy   2. Chest pain       MDM          Nelia Shi, MD 10/25/11 [phone]

## 2011-11-14 ENCOUNTER — Encounter (HOSPITAL_COMMUNITY): Payer: Self-pay

## 2011-11-15 ENCOUNTER — Ambulatory Visit (HOSPITAL_COMMUNITY)
Admission: RE | Admit: 2011-11-15 | Discharge: 2011-11-15 | Disposition: A | Discharge status: Home / Self Care | Payer: Self-pay | Source: Ambulatory Visit | Attending: Internal Medicine | Admitting: Internal Medicine

## 2011-11-15 ENCOUNTER — Encounter (HOSPITAL_COMMUNITY): Payer: Self-pay

## 2011-11-15 ENCOUNTER — Other Ambulatory Visit: Payer: Self-pay

## 2011-11-15 VITALS — BP 118/90 | HR 81

## 2011-11-15 DIAGNOSIS — I5022 Chronic systolic (congestive) heart failure: Secondary | ICD-10-CM

## 2011-11-15 NOTE — Progress Notes (Signed)
  Weight Range   Baseline proBNP     HPI:  Edwin Tyler is a 51 y.o. male with history of uncontrolled HTN, HCV, systolic CHF (EF 16-10%) due to non-ischemic cardiomyopathy without evidence of CAD (last cath 8/26). Presents to establish care in HF clinic. [facility] with 3 guards.  Admitted in August 2013 with possible syncope and found to have EF 25%. Cath with clean cors. Was seen by Dr. Ladona Ridgel in hospital and LifeVest placed. Readmitted in September with CP while being transported to prison.    Says he is under a lot of stress due to being confined. Gets very anxious. Occasional edema. Weight down. No orthopnea/PND. Taking meds. Very frustrated with LifeVest.    ROS: All systems negative except as listed in HPI, PMH and Problem List.  Past Medical History  Diagnosis Date  . Hypertension   . Hepatitis C     Treated with Interferon Ribavirin x19yr. Last treatment 06/2010  . GERD (gastroesophageal reflux disease)   . Acute systolic CHF (congestive heart failure) 10/05/2011  . Syncope 10/05/2011  . Chest pain 10/05/2011  . NICM (nonischemic cardiomyopathy) 10/05/2011    Cath with no CAD, normal filling pressures, EF 23-30%    Current Outpatient Prescriptions  Medication Sig Dispense Refill  . aspirin 81 MG chewable tablet Chew 1 tablet (81 mg total) by mouth daily.  30 tablet  0  . carvedilol (COREG) 6.25 MG tablet Take 1 tablet (6.25 mg total) by mouth 2 (two) times daily.  30 tablet  0  . citalopram (CELEXA) 20 MG tablet Take 1 tablet (20 mg total) by mouth daily.  30 tablet  0  . lisinopril (PRINIVIL,ZESTRIL) 10 MG tablet Take 1 tablet (10 mg total) by mouth daily.  30 tablet  0  . loratadine (CLARITIN) 10 MG tablet Take 10 mg by mouth daily.      Marland Kitchen omeprazole (PRILOSEC) 20 MG capsule Take 1 capsule (20 mg total) by mouth daily.  30 capsule  3  . Omega-3 Fatty Acids (FISH OIL PO) Take 1 capsule by mouth daily.         PHYSICAL EXAM: Filed Vitals:   11/15/11 1120  BP: 118/90  Pulse: 81  SpO2: 98%    General:  Well appearing. No resp difficulty. In schackles HEENT: normal Neck: supple. JVP flat. Carotids 2+ bilaterally; no bruits. No lymphadenopathy or thryomegaly appreciated. Cor: PMI normal. Regular rate & rhythm. No rubs, gallops or murmurs. Lungs: clear Abdomen: soft, nontender, nondistended. No hepatosplenomegaly. No bruits or masses. Good bowel sounds. Extremities: no cyanosis, clubbing, rash, edema Neuro: alert & orientedx3, cranial nerves grossly intact. Moves all 4 extremities w/o difficulty. Affect pleasant.   ECG: Sinus rhythm   ASSESSMENT & PLAN:

## 2011-11-15 NOTE — Assessment & Plan Note (Addendum)
NYHA II. Volume status stable. Increase carvedilol 9.375 mg twice a day. Start Spironolactone 12.5 mg daily.  Check BMET 11/22/11 and fax results [phone]. Change to low salt diet. Weigh and check blood pressure daily. Instructed [facility] to contact HF clinic if weight is 219 or greater.   Patient seen and examined with Tonye Becket, NP. We discussed all aspects of the encounter. I agree with the assessment and plan as stated above.  Doing well from a HF perspective. We did echo in clinic and EF still in the 25-30% Volume status looks good. LifeVest interrogated and no events. We discussed pros/cons of Vest and he prefers to discontinue it at this point. Realizes small risk of fatal arrhythmia. Agree with med changes as above.

## 2011-12-06 ENCOUNTER — Ambulatory Visit (HOSPITAL_COMMUNITY): Admission: RE | Admit: 2011-12-06 | Payer: Self-pay | Source: Ambulatory Visit

## 2011-12-17 ENCOUNTER — Encounter (HOSPITAL_COMMUNITY): Payer: Self-pay

## 2011-12-18 ENCOUNTER — Encounter (HOSPITAL_COMMUNITY): Payer: Self-pay

## 2012-01-28 ENCOUNTER — Encounter (HOSPITAL_COMMUNITY): Payer: Self-pay

## 2012-01-28 ENCOUNTER — Other Ambulatory Visit (HOSPITAL_COMMUNITY): Payer: Self-pay

## 2013-05-15 IMAGING — CR DG CHEST 1V PORT
1 series · 1 of 1 positions shown · non-contrast
Comparison: 10/04/2011

CLINICAL DATA: Chest pain

PORTABLE CHEST - 1 VIEW

[AP]
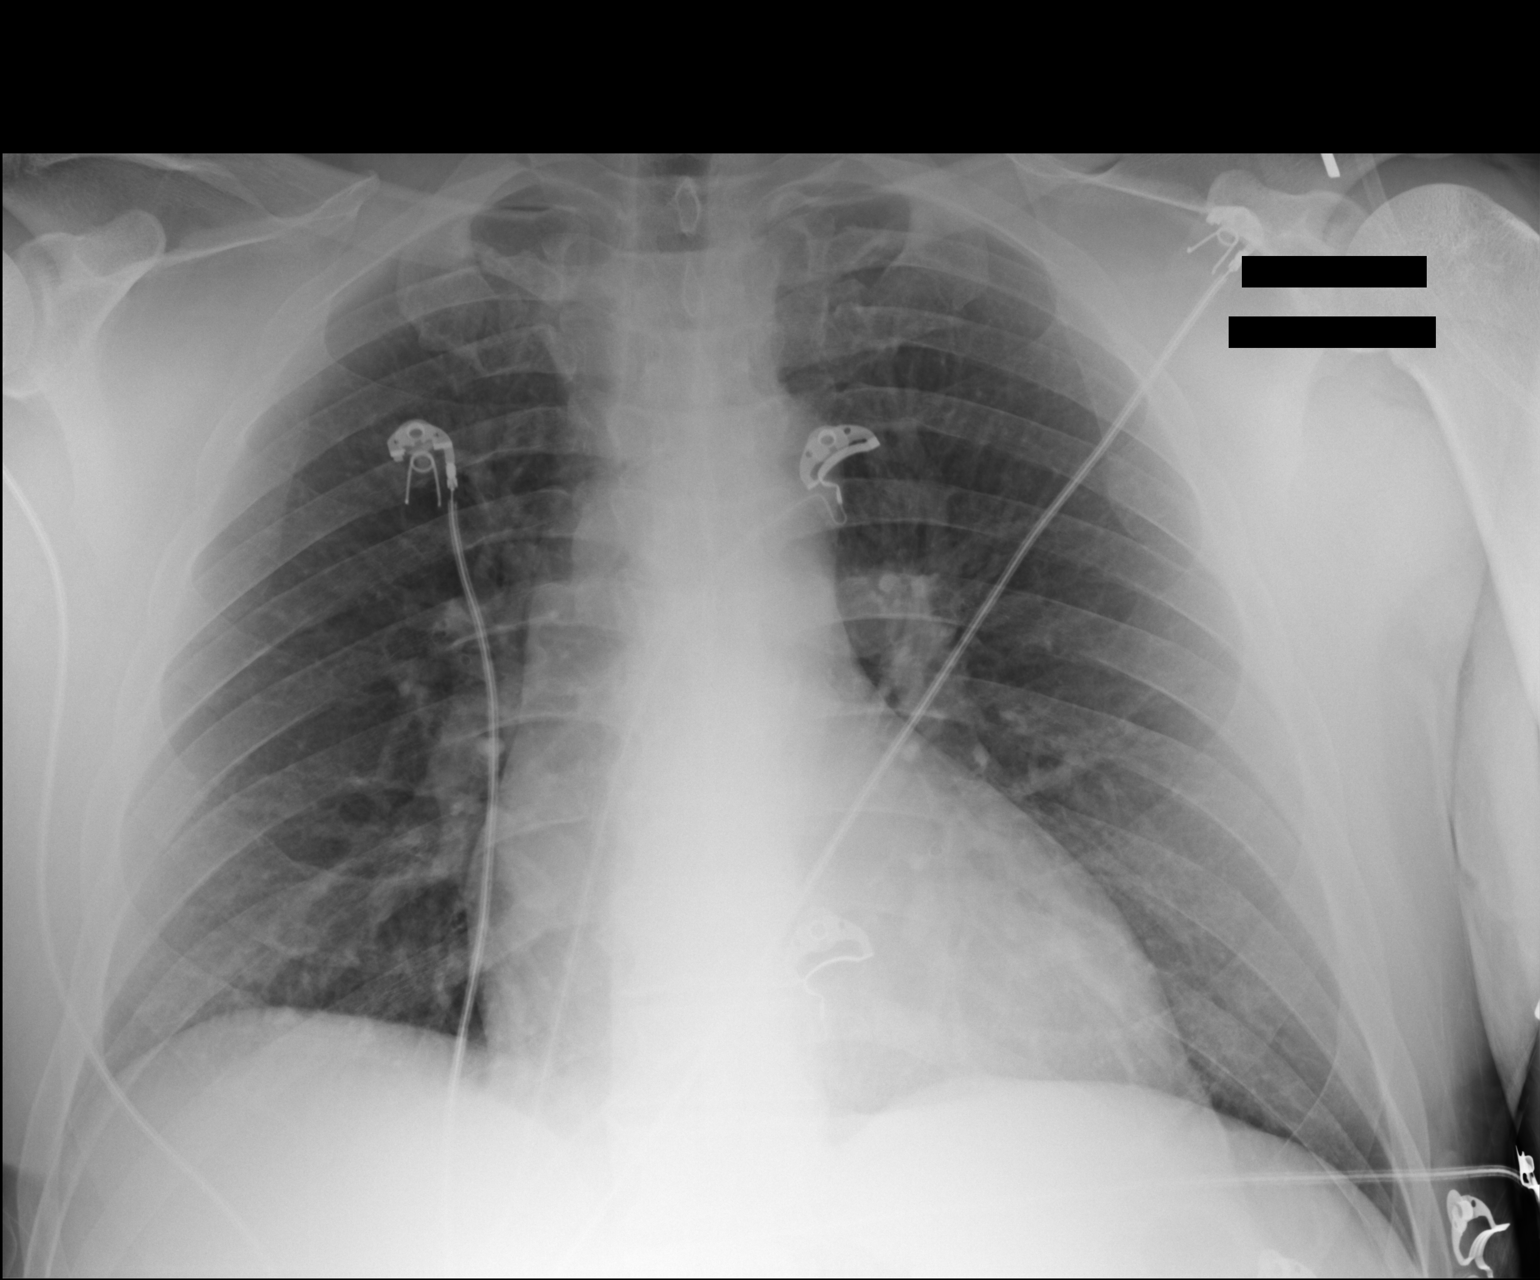

[1 of 1 positions shown; findings below may reference images not displayed]

FINDINGS: Cardiac enlarged with normal pulmonary vascularity.  No
focal airspace consolidation in the lungs.  No blunting of
costophrenic angles.  No pneumothorax.  Mediastinal contours appear
intact.  No significant change since previous study.
IMPRESSION: No evidence of active pulmonary disease.

## 2014-01-21 ENCOUNTER — Encounter (HOSPITAL_COMMUNITY): Payer: Self-pay | Admitting: Cardiovascular Disease

## 2019-02-05 ENCOUNTER — Encounter (HOSPITAL_COMMUNITY): Payer: Self-pay | Admitting: Emergency Medicine

## 2019-02-05 ENCOUNTER — Other Ambulatory Visit: Payer: Self-pay

## 2019-02-05 ENCOUNTER — Emergency Department (HOSPITAL_COMMUNITY)
Admission: EM | Admit: 2019-02-05 | Discharge: 2019-02-06 | Disposition: A | Discharge status: Home / Self Care | Attending: Emergency Medicine | Admitting: Emergency Medicine

## 2019-02-05 DIAGNOSIS — G8929 Other chronic pain: Secondary | ICD-10-CM | POA: Insufficient documentation

## 2019-02-05 DIAGNOSIS — I10 Essential (primary) hypertension: Secondary | ICD-10-CM | POA: Insufficient documentation

## 2019-02-05 DIAGNOSIS — R7309 Other abnormal glucose: Secondary | ICD-10-CM | POA: Diagnosis not present

## 2019-02-05 DIAGNOSIS — Z85828 Personal history of other malignant neoplasm of skin: Secondary | ICD-10-CM | POA: Diagnosis not present

## 2019-02-05 DIAGNOSIS — M5442 Lumbago with sciatica, left side: Secondary | ICD-10-CM | POA: Diagnosis not present

## 2019-02-05 DIAGNOSIS — R1032 Left lower quadrant pain: Secondary | ICD-10-CM | POA: Diagnosis not present

## 2019-02-05 DIAGNOSIS — M79605 Pain in left leg: Secondary | ICD-10-CM

## 2019-02-05 DIAGNOSIS — R1909 Other intra-abdominal and pelvic swelling, mass and lump: Secondary | ICD-10-CM

## 2019-02-05 DIAGNOSIS — R200 Anesthesia of skin: Secondary | ICD-10-CM | POA: Diagnosis present

## 2019-02-05 LAB — COMPREHENSIVE METABOLIC PANEL
ALT: 31 U/L (ref 0–44)
AST: 25 U/L (ref 15–41)
Albumin: 4.3 g/dL (ref 3.5–5.0)
Alkaline Phosphatase: 78 U/L (ref 38–126)
Anion gap: 8 (ref 5–15)
BUN: 18 mg/dL (ref 6–20)
CO2: 22 mmol/L (ref 22–32)
Calcium: 9.1 mg/dL (ref 8.9–10.3)
Chloride: 107 mmol/L (ref 98–111)
Creatinine, Ser: 0.77 mg/dL (ref 0.61–1.24)
GFR calc Af Amer: >60 mL/min (ref >60)
GFR calc non Af Amer: >60 mL/min (ref >60)
Glucose, Bld: 151 mg/dL — ABNORMAL HIGH (ref 70–99)
Potassium: 4.1 mmol/L (ref 3.5–5.1)
Sodium: 137 mmol/L (ref 135–145)
Total Bilirubin: 0.5 mg/dL (ref 0.3–1.2)
Total Protein: 7.6 g/dL (ref 6.5–8.1)

## 2019-02-05 LAB — CBC
HCT: 45.3 % (ref 39.0–52.0)
Hemoglobin: 15.7 g/dL (ref 13.0–17.0)
MCH: 31.7 pg (ref 26.0–34.0)
MCHC: 34.7 g/dL (ref 30.0–36.0)
MCV: 91.5 fL (ref 80.0–100.0)
Platelets: 210 10*3/uL (ref 150–400)
RBC: 4.95 MIL/uL (ref 4.22–5.81)
RDW: 12.3 % (ref 11.5–15.5)
WBC: 13.4 10*3/uL — ABNORMAL HIGH (ref 4.0–10.5)
nRBC: 0 % (ref 0.0–0.2)

## 2019-02-05 MED ORDER — HYDROMORPHONE HCL 1 MG/ML IJ SOLN
1.0000 mg | Freq: Once | INTRAMUSCULAR | Status: AC
  Administered 2019-02-05: 22:00:00 1 mg via INTRAVENOUS
  Filled 2019-02-05: qty 1

## 2019-02-05 NOTE — ED Triage Notes (Signed)
Pt reports a mass to left testicle, c/o pain from left hip to left foot, reports leg goes numb and he has fallen due to leg numbness

## 2019-02-05 NOTE — ED Provider Notes (Addendum)
[facility] Emergency Department Provider Note MRN:  [id number]  Arrival date & time: 02/05/19     Chief Complaint   Numbness History of Present Illness   Edwin Tyler is a 58 y.o. year-old male with a history of heart failure, hypertension, hepatitis C presenting to the ED with chief complaint of numbness.  Patient explains that he fell off of a ladder 20 months ago causing him injuries to his back, left groin.  He has had chronic pain since that time.  Pain is worsening over the past few days.  Has been experiencing numbness to the left leg for the past 2 days.  Also endorsing some weakness to the left leg.  Has been having some issues voiding urine for the past several days.  Denies issues with bowel movements.  Denies fever, denies IV drug use.  Pain in his leg and back is severe, 10 out of 10, worse with motion.  Pain seems to radiate from back to buttocks to anterior thigh.  He is also concerned that he has a mass or hernia to his left groin.  He feels an abnormality in this area ever since he fell off of the ladder.  Review of Systems  A complete 10 system review of systems was obtained and all systems are negative except as noted in the HPI and PMH.   Patient's Health History    Past Medical History:  Diagnosis Date  . Acute systolic CHF (congestive heart failure) ([place]) 10/05/2011  . Chest pain 10/05/2011  . GERD (gastroesophageal reflux disease)   . Hepatitis C    Treated with Interferon Ribavirin x5yr. Last treatment 06/2010  . Hypertension   . NICM (nonischemic cardiomyopathy) ([place]) 10/05/2011   Cath with no CAD, normal filling pressures, EF 23-30%  . Syncope 10/05/2011    Past Surgical History:  Procedure Laterality Date  . BASAL CELL CARCINOMA EXCISION     back  . CARDIAC CATHETERIZATION    . HAND SURGERY     left, glass removal  . LEFT AND RIGHT HEART CATHETERIZATION WITH CORONARY ANGIOGRAM N/A 10/08/2011   Procedure: LEFT AND RIGHT HEART  CATHETERIZATION WITH CORONARY ANGIOGRAM;  Surgeon: Burnell Blanks, MD;  Location: [facility] CATH LAB;  Service: Cardiovascular;  Laterality: N/A;  . ORIF CONGENITAL HIP DISLOCATION    . TONSILLECTOMY      Family History  Problem Relation Age of Onset  . Heart disease Mother        Died of MI at 41  . Heart disease Brother        PCI at 90  . Heart disease Maternal Uncle        PCI at 9    Social History   Socioeconomic History  . Marital status: Single    Spouse name: Not on file  . Number of children: Not on file  . Years of education: Not on file  . Highest education level: Not on file  Occupational History  . Not on file  Tobacco Use  . Smoking status: Former Smoker    Years: 3.00    Quit date: 02/12/1981    Years since quitting: 38.0  . Smokeless tobacco: Never Used  Substance and Sexual Activity  . Alcohol use: Yes    Comment: 6 beers/wk, 1 pint liquor/wk  . Drug use: No  . Sexual activity: Not on file  Other Topics Concern  . Not on file  Social History Narrative  . Not on file  Social Determinants of Health   Financial Resource Strain:   . Difficulty of Paying Living Expenses: Not on file  Food Insecurity:   . Worried About Programme researcher, broadcasting/film/videounning Out of Food in the Last Year: Not on file  . Ran Out of Food in the Last Year: Not on file  Transportation Needs:   . Lack of Transportation (Medical): Not on file  . Lack of Transportation (Non-Medical): Not on file  Physical Activity:   . Days of Exercise per Week: Not on file  . Minutes of Exercise per Session: Not on file  Stress:   . Feeling of Stress : Not on file  Social Connections:   . Frequency of Communication with Friends and Family: Not on file  . Frequency of Social Gatherings with Friends and Family: Not on file  . Attends Religious Services: Not on file  . Active Member of Clubs or Organizations: Not on file  . Attends BankerClub or Organization Meetings: Not on file  . Marital Status: Not on file  Intimate  Partner Violence:   . Fear of Current or Ex-Partner: Not on file  . Emotionally Abused: Not on file  . Physically Abused: Not on file  . Sexually Abused: Not on file     Physical Exam  Vital Signs and Nursing Notes reviewed Vitals:   02/05/19 2130  BP: (!) 147/109  Pulse: 83  Resp: 17  Temp: 98.1 F (36.7 C)  SpO2: 100%    CONSTITUTIONAL: Well-appearing, in moderate distress due to pain NEURO:  Alert and oriented x 3, decreased sensation to the left leg EYES:  eyes equal and reactive ENT/NECK:  no LAD, no JVD CARDIO: Regular rate, well-perfused, normal S1 and S2 PULM:  CTAB no wheezing or rhonchi GI/GU:  normal bowel sounds, non-distended, non-tender; tenderness to the left growing, no obvious hernia MSK/SPINE:  No gross deformities, no edema SKIN:  no rash, atraumatic PSYCH:  Appropriate speech and behavior  Diagnostic and Interventional Summary    EKG Interpretation  Date/Time:    Ventricular Rate:    PR Interval:    QRS Duration:   QT Interval:    QTC Calculation:   R Axis:     Text Interpretation:        Labs Reviewed  CBC  COMPREHENSIVE METABOLIC PANEL    No orders to display    Medications  HYDROmorphone (DILAUDID) injection 1 mg (1 mg Intravenous Given 02/05/19 2213)     Procedures  /  Critical Care Procedures  ED Course and Medical Decision Making  I have reviewed the triage vital signs and the nursing notes.  Pertinent labs & imaging results that were available during my care of the patient were reviewed by me and considered in my medical decision making (see below for details).     Chronic left groin pain and sensation of mass for 20 months, would likely benefit from ultrasound of this area for further delineation.  More concerning would be patient's new left leg numbness and weakness for the past 2 days as well as his acute on chronic back pain which radiates to the left leg.  Will need MRI to exclude myelopathy.  Doubt intra-abdominal  process given the lack of abdominal tenderness, doubt aortic pathology.  [facility].  Accepting ED physician Dr. Particia NearingHaviland.    Elmer SowMichael M. Pilar PlateBero, MD [facility] [email or web address]  Final Clinical Impressions(s) / ED Diagnoses  ICD-10-CM   1. Pain of left lower extremity  M79.605   2. Chronic midline low back pain with left-sided sciatica  M54.42    G89.29   3. Numbness  R20.0     ED Discharge Orders    None       Discharge Instructions Discussed with and Provided to Patient:   Discharge Instructions   None       Sabas Sous, MD 02/05/19 2216    Sabas Sous, MD 02/05/19 2230

## 2019-02-06 ENCOUNTER — Emergency Department (HOSPITAL_COMMUNITY)

## 2019-02-06 MED ORDER — BACLOFEN 10 MG PO TABS: 10.0000 mg | ORAL_TABLET | Freq: Three times a day (TID) | ORAL | 0 refills | Status: AC

## 2019-02-06 MED ORDER — HYDROMORPHONE HCL 1 MG/ML IJ SOLN
1.0000 mg | Freq: Once | INTRAMUSCULAR | Status: AC
  Administered 2019-02-06: 02:00:00 1 mg via INTRAVENOUS
  Filled 2019-02-06: qty 1

## 2019-02-06 MED ORDER — OXYCODONE-ACETAMINOPHEN 5-325 MG PO TABS: 1.0000 | ORAL_TABLET | ORAL | 0 refills | Status: AC | PRN

## 2019-02-06 NOTE — ED Notes (Addendum)
Pt transported to MRI 

## 2019-02-06 NOTE — Discharge Instructions (Signed)
Your ultrasound did not show any sign of a hernia or mass.   Your MRI scans did not show any sign of disc or spinal cord problem.  Continue taking indomethacin.  Apply ice to any painful area.

## 2019-02-06 NOTE — ED Provider Notes (Signed)
Patient transferred from [facility] for MRI of thoracic and lumbar spine, ultrasound of pelvis.  He is still complaining of significant pain and is given additional hydromorphone.  Imaging studies have been completed with results pending.  MRI scans of thoracic and lumbar spine, ultrasound of the left groin did not show any acute process.  When patient showed me where he is having the groin pain, he he shows clear tenderness along the inguinal ligament and this appears to be purely musculoskeletal pain and no evidence of hernia or mass.  He states that Lioresal had been helpful for him in the past, but it had been discontinued.  He is discharged with prescription for the or so on day small number of oxycodone-acetaminophen tablets.  He is referred back to his primary care provider.  Results for orders placed or performed during the hospital encounter of 02/05/19  CBC  Result Value Ref Range   WBC 13.4 (H) 4.0 - 10.5 K/uL   RBC 4.95 4.22 - 5.81 MIL/uL   Hemoglobin 15.7 13.0 - 17.0 g/dL   HCT 45.3 39.0 - 52.0 %   MCV 91.5 80.0 - 100.0 fL   MCH 31.7 26.0 - 34.0 pg   MCHC 34.7 30.0 - 36.0 g/dL   RDW 12.3 11.5 - 15.5 %   Platelets 210 150 - 400 K/uL   nRBC 0.0 0.0 - 0.2 %  CMP  Result Value Ref Range   Sodium 137 135 - 145 mmol/L   Potassium 4.1 3.5 - 5.1 mmol/L   Chloride 107 98 - 111 mmol/L   CO2 22 22 - 32 mmol/L   Glucose, Bld 151 (H) 70 - 99 mg/dL   BUN 18 6 - 20 mg/dL   Creatinine, Ser 0.77 0.61 - 1.24 mg/dL   Calcium 9.1 8.9 - 10.3 mg/dL   Total Protein 7.6 6.5 - 8.1 g/dL   Albumin 4.3 3.5 - 5.0 g/dL   AST 25 15 - 41 U/L   ALT 31 0 - 44 U/L   Alkaline Phosphatase 78 38 - 126 U/L   Total Bilirubin 0.5 0.3 - 1.2 mg/dL   GFR calc non Af Amer >60 >60 mL/min   GFR calc Af Amer >60 >60 mL/min   Anion gap 8 5 - 15   MR THORACIC SPINE WO CONTRAST  Result Date: 02/06/2019 CLINICAL DATA:  Left leg numbness and weakness. Urinary retention. Back pain. EXAM: MRI THORACIC AND  LUMBAR SPINE WITHOUT CONTRAST TECHNIQUE: Multiplanar and multiecho pulse sequences of the thoracic and lumbar spine were obtained without intravenous contrast. COMPARISON:  None. FINDINGS: MRI THORACIC SPINE FINDINGS Alignment:  Physiologic Vertebrae: No fracture, evidence of discitis, or bone lesion. Cord:  Normal signal and morphology. Paraspinal and other soft tissues: Negative. Disc levels: T8-9: Small right subarticular disc protrusion without spinal canal stenosis. T9-10: Small central disc protrusion without spinal canal stenosis. MRI LUMBAR SPINE FINDINGS Segmentation: Transitional lumbosacral anatomy. There are 6 lumbar vertebral bodies. The lowest disc space is considered L6-S1. Alignment:  Normal Vertebrae:  No fracture, evidence of discitis, or bone lesion. Conus medullaris and cauda equina: Conus extends to the L2 level. Conus and cauda equina appear normal. Paraspinal and other soft tissues: Negative Disc levels: L1-L2: Disc desiccation with mild bulge. No stenosis. L2-L3: Disc desiccation and mild bulge. No stenosis. L3-L4: Disc desiccation without bulge. No spinal canal or neural foraminal stenosis. L4-L5: Disc desiccation and mild left asymmetric bulge. Mild narrowing of the left lateral recess without central spinal canal stenosis.  No neural foraminal stenosis. L5-6: Normal disc space. Mild hypertrophy of facets. No spinal canal or neuroforaminal stenosis. L6-S1: No disc herniation or stenosis. Normal facets. Visualized sacrum: Normal. IMPRESSION: 1. 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 2. Mild thoracic and lumbar degenerative disc disease without spinal canal or neural foraminal stenosis. Electronically Signed   By: Deatra Robinson M.D.   On: 02/06/2019 01:06   MR LUMBAR SPINE WO CONTRAST  Result Date: 02/06/2019 CLINICAL DATA:  Left leg numbness and weakness. Urinary retention. Back pain. EXAM: MRI THORACIC AND LUMBAR SPINE WITHOUT CONTRAST TECHNIQUE: Multiplanar  and multiecho pulse sequences of the thoracic and lumbar spine were obtained without intravenous contrast. COMPARISON:  None. FINDINGS: MRI THORACIC SPINE FINDINGS Alignment:  Physiologic Vertebrae: No fracture, evidence of discitis, or bone lesion. Cord:  Normal signal and morphology. Paraspinal and other soft tissues: Negative. Disc levels: T8-9: Small right subarticular disc protrusion without spinal canal stenosis. T9-10: Small central disc protrusion without spinal canal stenosis. MRI LUMBAR SPINE FINDINGS Segmentation: Transitional lumbosacral anatomy. There are 6 lumbar vertebral bodies. The lowest disc space is considered L6-S1. Alignment:  Normal Vertebrae:  No fracture, evidence of discitis, or bone lesion. Conus medullaris and cauda equina: Conus extends to the L2 level. Conus and cauda equina appear normal. Paraspinal and other soft tissues: Negative Disc levels: L1-L2: Disc desiccation with mild bulge. No stenosis. L2-L3: Disc desiccation and mild bulge. No stenosis. L3-L4: Disc desiccation without bulge. No spinal canal or neural foraminal stenosis. L4-L5: Disc desiccation and mild left asymmetric bulge. Mild narrowing of the left lateral recess without central spinal canal stenosis. No neural foraminal stenosis. L5-6: Normal disc space. Mild hypertrophy of facets. No spinal canal or neuroforaminal stenosis. L6-S1: No disc herniation or stenosis. Normal facets. Visualized sacrum: Normal. IMPRESSION: 1. 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 2. Mild thoracic and lumbar degenerative disc disease without spinal canal or neural foraminal stenosis. Electronically Signed   By: Deatra Robinson M.D.   On: 02/06/2019 01:06   US PELVIS LIMITED (TRANSABDOMINAL ONLY)  Result Date: 02/06/2019 CLINICAL DATA:  Left groin palpable lump EXAM: ULTRASOUND OF left GROIN SOFT TISSUES TECHNIQUE: Ultrasound examination of the groin soft tissues was performed in the area of clinical concern.  COMPARISON:  None. FINDINGS: Within the area of interest over the left groin there is no soft tissue mass, complex fluid collection or adenopathy seen. No hernia is noted. IMPRESSION: Normal examination within the area of interest. Electronically Signed   By: Jonna Clark M.D.   On: 02/06/2019 01:21   Images viewed by me.    Dione Booze, MD 02/06/19 [phone]

## 2019-02-06 NOTE — ED Notes (Signed)
Report given to triage RN on number provided by guard. All questions answered

## 2022-06-27 ENCOUNTER — Other Ambulatory Visit: Payer: Self-pay | Admitting: Nurse Practitioner

## 2023-04-18 ENCOUNTER — Encounter: Payer: Self-pay | Admitting: *Deleted
